# Patient Record
Sex: Female | Born: 1970
Health system: Southern US, Community
[De-identification: ages and names within clinical notes are randomized; demographics above are authoritative.]

## PROBLEM LIST (undated history)

## (undated) DIAGNOSIS — J45909 Unspecified asthma, uncomplicated: Secondary | ICD-10-CM

## (undated) DIAGNOSIS — M949 Disorder of cartilage, unspecified: Secondary | ICD-10-CM

## (undated) DIAGNOSIS — D739 Disease of spleen, unspecified: Secondary | ICD-10-CM

## (undated) DIAGNOSIS — E785 Hyperlipidemia, unspecified: Secondary | ICD-10-CM

## (undated) DIAGNOSIS — M899 Disorder of bone, unspecified: Secondary | ICD-10-CM

## (undated) DIAGNOSIS — M84376A Stress fracture, unspecified foot, initial encounter for fracture: Secondary | ICD-10-CM

## (undated) DIAGNOSIS — D7389 Other diseases of spleen: Secondary | ICD-10-CM

## (undated) DIAGNOSIS — N200 Calculus of kidney: Secondary | ICD-10-CM

## (undated) DIAGNOSIS — K589 Irritable bowel syndrome without diarrhea: Secondary | ICD-10-CM

## (undated) DIAGNOSIS — J309 Allergic rhinitis, unspecified: Secondary | ICD-10-CM

## (undated) HISTORY — PX: BREAST SURGERY: SHX581

## (undated) HISTORY — DX: Disease of spleen, unspecified: D73.9

## (undated) HISTORY — DX: Stress fracture, unspecified foot, initial encounter for fracture: M84.376A

## (undated) HISTORY — PX: OTHER SURGICAL HISTORY: SHX169

## (undated) HISTORY — DX: Unspecified asthma, uncomplicated: J45.909

## (undated) HISTORY — DX: Hyperlipidemia, unspecified: E78.5

## (undated) HISTORY — DX: Calculus of kidney: N20.0

## (undated) HISTORY — DX: Allergic rhinitis, unspecified: J30.9

## (undated) HISTORY — DX: Other diseases of spleen: D73.89

## (undated) HISTORY — DX: Disorder of cartilage, unspecified: M94.9

## (undated) HISTORY — DX: Disorder of bone, unspecified: M89.9

## (undated) HISTORY — DX: Irritable bowel syndrome, unspecified: K58.9

---

## 1998-07-30 ENCOUNTER — Other Ambulatory Visit: Admission: RE | Admit: 1998-07-30 | Discharge: 1998-07-30 | Payer: Self-pay | Admitting: Obstetrics and Gynecology

## 1999-01-15 ENCOUNTER — Other Ambulatory Visit: Admission: RE | Admit: 1999-01-15 | Discharge: 1999-01-15 | Payer: Self-pay | Admitting: Obstetrics and Gynecology

## 2000-02-29 ENCOUNTER — Other Ambulatory Visit: Admission: RE | Admit: 2000-02-29 | Discharge: 2000-02-29 | Payer: Self-pay | Admitting: Obstetrics and Gynecology

## 2001-03-14 ENCOUNTER — Other Ambulatory Visit: Admission: RE | Admit: 2001-03-14 | Discharge: 2001-03-14 | Payer: Self-pay | Admitting: Obstetrics and Gynecology

## 2002-04-12 ENCOUNTER — Other Ambulatory Visit: Admission: RE | Admit: 2002-04-12 | Discharge: 2002-04-12 | Payer: Self-pay | Admitting: Obstetrics and Gynecology

## 2003-04-17 ENCOUNTER — Other Ambulatory Visit: Admission: RE | Admit: 2003-04-17 | Discharge: 2003-04-17 | Payer: Self-pay | Admitting: Obstetrics and Gynecology

## 2004-05-11 ENCOUNTER — Other Ambulatory Visit: Admission: RE | Admit: 2004-05-11 | Discharge: 2004-05-11 | Payer: Self-pay | Admitting: Obstetrics and Gynecology

## 2005-03-15 ENCOUNTER — Ambulatory Visit: Payer: Self-pay | Admitting: Internal Medicine

## 2005-06-07 ENCOUNTER — Other Ambulatory Visit: Admission: RE | Admit: 2005-06-07 | Discharge: 2005-06-07 | Payer: Self-pay | Admitting: Obstetrics and Gynecology

## 2005-11-10 ENCOUNTER — Ambulatory Visit: Payer: Self-pay | Admitting: Internal Medicine

## 2006-01-07 ENCOUNTER — Ambulatory Visit: Payer: Self-pay | Admitting: Internal Medicine

## 2006-01-17 ENCOUNTER — Encounter: Admission: RE | Admit: 2006-01-17 | Discharge: 2006-01-17 | Payer: Self-pay | Admitting: Gastroenterology

## 2006-05-03 ENCOUNTER — Ambulatory Visit: Payer: Self-pay | Admitting: Internal Medicine

## 2006-06-30 ENCOUNTER — Ambulatory Visit: Payer: Self-pay | Admitting: Internal Medicine

## 2006-07-19 ENCOUNTER — Ambulatory Visit: Payer: Self-pay | Admitting: Internal Medicine

## 2006-07-25 ENCOUNTER — Ambulatory Visit: Payer: Self-pay | Admitting: Internal Medicine

## 2006-10-10 ENCOUNTER — Ambulatory Visit: Payer: Self-pay | Admitting: Internal Medicine

## 2006-11-09 ENCOUNTER — Ambulatory Visit: Payer: Self-pay | Admitting: Internal Medicine

## 2006-11-21 ENCOUNTER — Ambulatory Visit: Payer: Self-pay | Admitting: Internal Medicine

## 2006-12-09 ENCOUNTER — Ambulatory Visit: Payer: Self-pay | Admitting: Internal Medicine

## 2007-03-01 ENCOUNTER — Ambulatory Visit: Payer: Self-pay | Admitting: Pulmonary Disease

## 2007-05-15 ENCOUNTER — Ambulatory Visit: Payer: Self-pay | Admitting: Internal Medicine

## 2007-06-20 ENCOUNTER — Ambulatory Visit: Payer: Self-pay | Admitting: Internal Medicine

## 2007-06-21 DIAGNOSIS — J45909 Unspecified asthma, uncomplicated: Secondary | ICD-10-CM

## 2007-06-21 DIAGNOSIS — E785 Hyperlipidemia, unspecified: Secondary | ICD-10-CM

## 2007-06-21 DIAGNOSIS — Q825 Congenital non-neoplastic nevus: Secondary | ICD-10-CM | POA: Insufficient documentation

## 2007-06-21 DIAGNOSIS — J309 Allergic rhinitis, unspecified: Secondary | ICD-10-CM

## 2007-07-05 ENCOUNTER — Ambulatory Visit: Payer: Self-pay | Admitting: Internal Medicine

## 2007-07-17 ENCOUNTER — Ambulatory Visit: Payer: Self-pay | Admitting: Internal Medicine

## 2007-07-17 LAB — CONVERTED CEMR LAB
Basophils Relative: 0.5 % (ref 0.0–1.0)
Bilirubin Urine: NEGATIVE
Bilirubin, Direct: 0.1 mg/dL (ref 0.0–0.3)
CO2: 28 meq/L (ref 19–32)
Calcium: 8.4 mg/dL (ref 8.4–10.5)
Direct LDL: 162.2 mg/dL
Eosinophils Absolute: 0.2 10*3/uL (ref 0.0–0.6)
Eosinophils Relative: 3 % (ref 0.0–5.0)
GFR calc Af Amer: 91 mL/min
GFR calc non Af Amer: 75 mL/min
Glucose, Bld: 87 mg/dL (ref 70–99)
HDL: 44.8 mg/dL (ref >39.0)
Hemoglobin: 13.2 g/dL (ref 12.0–15.0)
Leukocytes, UA: NEGATIVE
Lymphocytes Relative: 35.5 % (ref 12.0–46.0)
MCV: 86.4 fL (ref 78.0–100.0)
Monocytes Absolute: 0.6 10*3/uL (ref 0.2–0.7)
Monocytes Relative: 9.4 % (ref 3.0–11.0)
Neutro Abs: 3 10*3/uL (ref 1.4–7.7)
Platelets: 259 10*3/uL (ref 150–400)
Potassium: 4 meq/L (ref 3.5–5.1)
Specific Gravity, Urine: 1.015 (ref 1.000–1.03)
TSH: 1.58 microintl units/mL (ref 0.35–5.50)
Total Protein: 6.6 g/dL (ref 6.0–8.3)
Triglycerides: 125 mg/dL (ref 0–149)
Urine Glucose: NEGATIVE mg/dL
WBC: 5.9 10*3/uL (ref 4.5–10.5)

## 2007-07-31 ENCOUNTER — Ambulatory Visit: Payer: Self-pay | Admitting: Internal Medicine

## 2007-09-18 ENCOUNTER — Encounter: Payer: Self-pay | Admitting: Internal Medicine

## 2007-09-18 ENCOUNTER — Ambulatory Visit: Payer: Self-pay | Admitting: Internal Medicine

## 2007-10-12 ENCOUNTER — Ambulatory Visit: Payer: Self-pay | Admitting: Internal Medicine

## 2007-10-12 DIAGNOSIS — H6690 Otitis media, unspecified, unspecified ear: Secondary | ICD-10-CM | POA: Insufficient documentation

## 2007-11-21 ENCOUNTER — Ambulatory Visit: Payer: Self-pay | Admitting: Internal Medicine

## 2008-04-01 ENCOUNTER — Ambulatory Visit: Payer: Self-pay | Admitting: Endocrinology

## 2008-04-12 ENCOUNTER — Ambulatory Visit: Payer: Self-pay | Admitting: Internal Medicine

## 2008-08-06 ENCOUNTER — Ambulatory Visit: Payer: Self-pay | Admitting: Internal Medicine

## 2008-08-06 DIAGNOSIS — J019 Acute sinusitis, unspecified: Secondary | ICD-10-CM

## 2008-08-26 ENCOUNTER — Ambulatory Visit: Payer: Self-pay | Admitting: Internal Medicine

## 2008-08-26 LAB — CONVERTED CEMR LAB
ALT: 12 units/L (ref 0–35)
Basophils Absolute: 0 10*3/uL (ref 0.0–0.1)
Basophils Relative: 0.9 % (ref 0.0–3.0)
Bilirubin Urine: NEGATIVE
CO2: 29 meq/L (ref 19–32)
Calcium: 8.7 mg/dL (ref 8.4–10.5)
Cholesterol: 220 mg/dL (ref 0–200)
Creatinine, Ser: 0.9 mg/dL (ref 0.4–1.2)
Crystals: NEGATIVE
Direct LDL: 142.9 mg/dL
GFR calc Af Amer: 90 mL/min
Glucose, Bld: 89 mg/dL (ref 70–99)
HCT: 36.8 % (ref 36.0–46.0)
Hemoglobin: 12.8 g/dL (ref 12.0–15.0)
Ketones, ur: NEGATIVE mg/dL
Lymphocytes Relative: 31.7 % (ref 12.0–46.0)
MCHC: 34.7 g/dL (ref 30.0–36.0)
Monocytes Absolute: 0.4 10*3/uL (ref 0.1–1.0)
Neutro Abs: 3.3 10*3/uL (ref 1.4–7.7)
RBC: 4.17 M/uL (ref 3.87–5.11)
Specific Gravity, Urine: 1.01 (ref 1.000–1.03)
TSH: 2.88 microintl units/mL (ref 0.35–5.50)
Total Protein, Urine: NEGATIVE mg/dL
Total Protein: 6.3 g/dL (ref 6.0–8.3)
Triglycerides: 156 mg/dL — ABNORMAL HIGH (ref 0–149)
Urine Glucose: NEGATIVE mg/dL
pH: 6.5 (ref 5.0–8.0)

## 2008-09-04 ENCOUNTER — Ambulatory Visit: Payer: Self-pay | Admitting: Internal Medicine

## 2009-01-17 ENCOUNTER — Ambulatory Visit: Payer: Self-pay | Admitting: Internal Medicine

## 2009-03-20 ENCOUNTER — Encounter (INDEPENDENT_AMBULATORY_CARE_PROVIDER_SITE_OTHER): Payer: Self-pay | Admitting: Obstetrics and Gynecology

## 2009-03-20 ENCOUNTER — Encounter: Admission: RE | Admit: 2009-03-20 | Discharge: 2009-03-20 | Payer: Self-pay | Admitting: Obstetrics and Gynecology

## 2009-04-01 ENCOUNTER — Encounter: Admission: RE | Admit: 2009-04-01 | Discharge: 2009-04-01 | Payer: Self-pay | Admitting: Obstetrics and Gynecology

## 2009-08-27 ENCOUNTER — Ambulatory Visit: Payer: Self-pay | Admitting: Internal Medicine

## 2009-08-27 LAB — CONVERTED CEMR LAB
ALT: 13 units/L (ref 0–35)
Alkaline Phosphatase: 46 units/L (ref 39–117)
Basophils Relative: 0.7 % (ref 0.0–3.0)
Bilirubin Urine: NEGATIVE
Bilirubin, Direct: 0 mg/dL (ref 0.0–0.3)
Calcium: 8.6 mg/dL (ref 8.4–10.5)
Creatinine, Ser: 0.9 mg/dL (ref 0.4–1.2)
Eosinophils Absolute: 0.2 10*3/uL (ref 0.0–0.7)
Eosinophils Relative: 2.5 % (ref 0.0–5.0)
GFR calc non Af Amer: 73.85 mL/min (ref >60)
Lymphocytes Relative: 25.4 % (ref 12.0–46.0)
Monocytes Relative: 6 % (ref 3.0–12.0)
Neutrophils Relative %: 65.4 % (ref 43.0–77.0)
Nitrite: NEGATIVE
RBC: 4.23 M/uL (ref 3.87–5.11)
Total CHOL/HDL Ratio: 4
Total Protein: 6.9 g/dL (ref 6.0–8.3)
Triglycerides: 187 mg/dL — ABNORMAL HIGH (ref 0.0–149.0)
WBC: 6.6 10*3/uL (ref 4.5–10.5)
pH: 7 (ref 5.0–8.0)

## 2009-08-29 ENCOUNTER — Ambulatory Visit: Payer: Self-pay | Admitting: Internal Medicine

## 2009-08-29 DIAGNOSIS — N390 Urinary tract infection, site not specified: Secondary | ICD-10-CM | POA: Insufficient documentation

## 2009-09-01 ENCOUNTER — Telehealth: Payer: Self-pay | Admitting: Internal Medicine

## 2009-09-05 ENCOUNTER — Ambulatory Visit: Payer: Self-pay | Admitting: Internal Medicine

## 2009-10-24 ENCOUNTER — Ambulatory Visit: Payer: Self-pay | Admitting: Internal Medicine

## 2009-12-22 ENCOUNTER — Telehealth: Payer: Self-pay | Admitting: Internal Medicine

## 2010-03-02 ENCOUNTER — Ambulatory Visit: Payer: Self-pay | Admitting: Internal Medicine

## 2010-03-02 DIAGNOSIS — M84376A Stress fracture, unspecified foot, initial encounter for fracture: Secondary | ICD-10-CM | POA: Insufficient documentation

## 2010-03-23 ENCOUNTER — Encounter: Admission: RE | Admit: 2010-03-23 | Discharge: 2010-03-23 | Payer: Self-pay | Admitting: Obstetrics and Gynecology

## 2010-03-31 ENCOUNTER — Ambulatory Visit: Payer: Self-pay | Admitting: Internal Medicine

## 2010-03-31 DIAGNOSIS — M899 Disorder of bone, unspecified: Secondary | ICD-10-CM | POA: Insufficient documentation

## 2010-03-31 DIAGNOSIS — M949 Disorder of cartilage, unspecified: Secondary | ICD-10-CM

## 2010-03-31 DIAGNOSIS — J069 Acute upper respiratory infection, unspecified: Secondary | ICD-10-CM | POA: Insufficient documentation

## 2010-03-31 LAB — CONVERTED CEMR LAB
Bilirubin Urine: NEGATIVE
Cholesterol, target level: 200 mg/dL
HDL goal, serum: 40 mg/dL
LDL Goal: 160 mg/dL
Leukocytes, UA: NEGATIVE
Nitrite: NEGATIVE

## 2010-06-05 ENCOUNTER — Ambulatory Visit: Payer: Self-pay | Admitting: Internal Medicine

## 2010-07-29 ENCOUNTER — Ambulatory Visit: Payer: Self-pay | Admitting: Internal Medicine

## 2010-07-29 DIAGNOSIS — L259 Unspecified contact dermatitis, unspecified cause: Secondary | ICD-10-CM

## 2010-08-27 ENCOUNTER — Ambulatory Visit: Payer: Self-pay | Admitting: Internal Medicine

## 2010-08-28 LAB — CONVERTED CEMR LAB
BUN: 12 mg/dL (ref 6–23)
Basophils Relative: 0.6 % (ref 0.0–3.0)
Bilirubin Urine: NEGATIVE
Chloride: 102 meq/L (ref 96–112)
Creatinine, Ser: 0.9 mg/dL (ref 0.4–1.2)
Eosinophils Absolute: 0.1 10*3/uL (ref 0.0–0.7)
Eosinophils Relative: 1.3 % (ref 0.0–5.0)
Glucose, Bld: 77 mg/dL (ref 70–99)
HDL: 59.4 mg/dL (ref >39.00)
Hemoglobin: 12.5 g/dL (ref 12.0–15.0)
Leukocytes, UA: NEGATIVE
Lymphocytes Relative: 28.1 % (ref 12.0–46.0)
MCHC: 35.1 g/dL (ref 30.0–36.0)
MCV: 91.1 fL (ref 78.0–100.0)
Monocytes Absolute: 0.4 10*3/uL (ref 0.1–1.0)
Neutro Abs: 3.2 10*3/uL (ref 1.4–7.7)
Nitrite: NEGATIVE
RBC: 3.91 M/uL (ref 3.87–5.11)
Total Bilirubin: 0.5 mg/dL (ref 0.3–1.2)
Total CHOL/HDL Ratio: 4
Triglycerides: 154 mg/dL — ABNORMAL HIGH (ref 0.0–149.0)
Urobilinogen, UA: 0.2 (ref 0.0–1.0)
WBC: 5.2 10*3/uL (ref 4.5–10.5)

## 2010-09-07 ENCOUNTER — Encounter: Payer: Self-pay | Admitting: Internal Medicine

## 2010-09-07 ENCOUNTER — Ambulatory Visit: Payer: Self-pay | Admitting: Internal Medicine

## 2010-09-07 DIAGNOSIS — R300 Dysuria: Secondary | ICD-10-CM

## 2010-09-07 LAB — CONVERTED CEMR LAB
Ketones, ur: NEGATIVE mg/dL
Urine Glucose: NEGATIVE mg/dL
Urobilinogen, UA: 0.2 (ref 0.0–1.0)

## 2010-10-07 ENCOUNTER — Encounter: Admission: RE | Admit: 2010-10-07 | Discharge: 2010-10-07 | Payer: Self-pay | Admitting: Obstetrics and Gynecology

## 2010-12-09 ENCOUNTER — Encounter: Payer: Self-pay | Admitting: Internal Medicine

## 2011-01-01 ENCOUNTER — Ambulatory Visit
Admission: RE | Admit: 2011-01-01 | Discharge: 2011-01-01 | Payer: Self-pay | Source: Home / Self Care | Attending: Surgery | Admitting: Surgery

## 2011-01-01 LAB — POCT HEMOGLOBIN-HEMACUE: Hemoglobin: 13.8 g/dL (ref 12.0–15.0)

## 2011-01-03 LAB — CONVERTED CEMR LAB
Blood in Urine, dipstick: NEGATIVE
Glucose, Urine, Semiquant: NEGATIVE
Ketones, urine, test strip: NEGATIVE
Protein, U semiquant: NEGATIVE
WBC Urine, dipstick: NEGATIVE

## 2011-01-05 NOTE — Assessment & Plan Note (Signed)
Summary: sore throat/ear pain/cd   Vital Signs:  Patient profile:   40 year old female Height:      66.5 inches Weight:      177 pounds BMI:     28.24 O2 Sat:      98 % on Room air Temp:     98.2 degrees F oral Pulse rate:   60 / minute BP sitting:   110 / 70  (left arm) Cuff size:   regular  Vitals Entered By: Zella Ball Ewing CMA (AAMA) (June 05, 2010 1:58 PM)  O2 Flow:  Room air CC: Sore throat, both ears hurt/RE   CC:  Sore throat and both ears hurt/RE.  History of Present Illness: here with acute onset facial pain, pressure, fever, and greenish d/c, as well as ear fullness but not severe pain, d/c, hearing loss, dizziness or n/v.  This is on top the nasal allergy symptoms mild uncontrolled in recent wks, not taking her allergy meds. Pt denies CP, sob, doe, wheezing, orthopnea, pnd, worsening LE edema, palps, dizziness or syncope  Overall o/w good compliance with meds, good tolerance.    Problems Prior to Update: 1)  Osteopenia  (ICD-733.90) 2)  Uri  (ICD-465.9) 3)  Stress Fracture, Foot  (ICD-733.94) 4)  Uti  (ICD-599.0) 5)  Preventive Health Care  (ICD-V70.0) 6)  Uti  (ICD-599.0) 7)  Preventive Health Care  (ICD-V70.0) 8)  Sinusitis- Acute-nos  (ICD-461.9) 9)  Otitis Media, Acute, Bilateral  (ICD-382.9) 10)  Family History Diabetes 1st Degree Relative  (ICD-V18.0) 11)  Family History of Cad Female 1st Degree Relative <50  (ICD-V17.3) 12)  Birthmark  (ICD-757.32) 13)  Hyperlipidemia  (ICD-272.4) 14)  Asthma  (ICD-493.90) 15)  Allergic Rhinitis  (ICD-477.9)  Medications Prior to Update: 1)  Advair Diskus 250-50 Mcg/dose Misc (Fluticasone-Salmeterol) .... Inhale 1 Puff As Directed Twice A Day 2)  Fexofenadine Hcl 180 Mg Tabs (Fexofenadine Hcl) .... Take 1 By Mouth Once Daily 3)  Ortho-Cyclen (28) 0.25-35 Mg-Mcg  Tabs (Norgestimate-Eth Estradiol) 4)  Levaquin 500 Mg Tabs (Levofloxacin) .Marland Kitchen.. 1 By Mouth Once Daily  Current Medications (verified): 1)  Advair Diskus 250-50  Mcg/dose Misc (Fluticasone-Salmeterol) .... Inhale 1 Puff As Directed Twice A Day 2)  Fexofenadine Hcl 180 Mg Tabs (Fexofenadine Hcl) .... Take 1 By Mouth Once Daily 3)  Ortho-Cyclen (28) 0.25-35 Mg-Mcg  Tabs (Norgestimate-Eth Estradiol) 4)  Cephalexin 500 Mg Caps (Cephalexin) .Marland Kitchen.. 1po Three Times A Day  Allergies (verified): 1)  ! Codeine 2)  ! Erythromycin 3)  ! Avelox (Moxifloxacin Hcl) 4)  ! Clarithromycin (Clarithromycin)  Past History:  Past Medical History: Last updated: 03/31/2010 Allergic rhinitis Asthma Hyperlipidemia Osteopenia  Past Surgical History: Last updated: 06/21/2007 Skin Graft-R Forehead  Social History: Last updated: 09/04/2008 Married Alcohol use-yes Former Smoker 1 son work - Product manager - [employer]  Risk Factors: Smoking Status: quit (10/12/2007)  Review of Systems       all otherwise negative per pt -    Physical Exam  General:  alert and overweight-appearing but contuing to try to lose wt Head:  normocephalic and atraumatic.   Eyes:  vision grossly intact, pupils equal, and pupils round.   Ears:  bilat tm's severe red, with mild bulging, canals clear, sinus tender bilat, right > left Nose:  nasal dischargemucosal pallor and mucosal edema.   Mouth:  pharyngeal erythema and fair dentition.   Neck:  supple and cervical lymphadenopathy.   Lungs:  normal respiratory effort and normal breath sounds.  Heart:  normal rate and regular rhythm.   Extremities:  no edema, no erythema    Impression & Recommendations:  Problem # 1:  SINUSITIS- ACUTE-NOS (ICD-461.9)  Her updated medication list for this problem includes:    Cephalexin 500 Mg Caps (Cephalexin) .Marland Kitchen... 1po three times a day with bilat ear involvement as well - treat as above, f/u any worsening signs or symptoms   Problem # 2:  ALLERGIC RHINITIS (ICD-477.9)  Her updated medication list for this problem includes:    Fexofenadine Hcl 180 Mg Tabs (Fexofenadine hcl) .Marland Kitchen...  Take 1 by mouth once daily treat as above, f/u any worsening signs or symptoms   Problem # 3:  ASTHMA (ICD-493.90)  Her updated medication list for this problem includes:    Advair Diskus 250-50 Mcg/dose Misc (Fluticasone-salmeterol) ..... Inhale 1 puff as directed twice a day treat as above, f/u any worsening signs or symptoms   Complete Medication List: 1)  Advair Diskus 250-50 Mcg/dose Misc (Fluticasone-salmeterol) .... Inhale 1 puff as directed twice a day 2)  Fexofenadine Hcl 180 Mg Tabs (Fexofenadine hcl) .... Take 1 by mouth once daily 3)  Ortho-cyclen (28) 0.25-35 Mg-mcg Tabs (Norgestimate-eth estradiol) 4)  Cephalexin 500 Mg Caps (Cephalexin) .Marland Kitchen.. 1po three times a day  Patient Instructions: 1)  Please take all new medications as prescribed  2)  Continue all previous medications as before this visit  3)  You can also use Mucinex OTC or it's generic for congestion  4)  , or Sudafed for the same 5)  Please schedule a follow-up appointment in 3 months with CPX labs Prescriptions: CEPHALEXIN 500 MG CAPS (CEPHALEXIN) 1po three times a day  #30 x 0   Entered and Authorized by:   Corwin Levins MD   Signed by:   Corwin Levins MD on 06/05/2010   Method used:   Print then Give to Patient   RxID:   [id number]

## 2011-01-05 NOTE — Assessment & Plan Note (Signed)
Summary: EARS ACHE---URINE BURN--STC   Vital Signs:  Patient profile:   40 year old female Height:      67 inches Weight:      184 pounds BMI:     28.92 O2 Sat:      98 % on Room air Temp:     98.2 degrees F oral Pulse rate:   62 / minute BP sitting:   110 / 72  (left arm) Cuff size:   regular  Vitals Entered ByZella Ball Ewing (March 02, 2010 11:04 AM)  O2 Flow:  Room air CC: ear pain, pain when urinating/RE   CC:  ear pain and pain when urinating/RE.  History of Present Illness: here with acute onset mild to mod bilat earache, fever, haedache , ST, , general malaise, as well as mild lower abd pain with dysuria for 2 days and  nausea without vomiting , and Pt denies CP, sob, doe, wheezing, orthopnea, pnd, worsening LE edema, palps, dizziness or syncope   Pt denies new neuro symptoms such as headache, facial or extremity weakness , and no high fever or chills., rash or joint pains.  Has lost about 10 lbs with better diet.   Also recently with right foot stress fracture, and hx left foot fx in the past;  no prior hx of bone disease or other fx, no prior dxa  Problems Prior to Update: 1)  Stress Fracture, Foot  (ICD-733.94) 2)  Uti  (ICD-599.0) 3)  Preventive Health Care  (ICD-V70.0) 4)  Uti  (ICD-599.0) 5)  Preventive Health Care  (ICD-V70.0) 6)  Sinusitis- Acute-nos  (ICD-461.9) 7)  Otitis Media, Acute, Bilateral  (ICD-382.9) 8)  Family History Diabetes 1st Degree Relative  (ICD-V18.0) 9)  Family History of Cad Female 1st Degree Relative <50  (ICD-V17.3) 10)  Birthmark  (ICD-757.32) 11)  Hyperlipidemia  (ICD-272.4) 12)  Asthma  (ICD-493.90) 13)  Allergic Rhinitis  (ICD-477.9)  Medications Prior to Update: 1)  Advair Diskus 250-50 Mcg/dose Misc (Fluticasone-Salmeterol) .... Inhale 1 Puff As Directed Twice A Day 2)  Fexofenadine Hcl 180 Mg Tabs (Fexofenadine Hcl) .... Take 1 By Mouth Once Daily 3)  Ortho-Cyclen (28) 0.25-35 Mg-Mcg  Tabs (Norgestimate-Eth Estradiol) 4)  Ceftin  250 Mg Tabs (Cefuroxime Axetil) .Marland Kitchen.. 1po Two Times A Day  Current Medications (verified): 1)  Advair Diskus 250-50 Mcg/dose Misc (Fluticasone-Salmeterol) .... Inhale 1 Puff As Directed Twice A Day 2)  Fexofenadine Hcl 180 Mg Tabs (Fexofenadine Hcl) .... Take 1 By Mouth Once Daily 3)  Ortho-Cyclen (28) 0.25-35 Mg-Mcg  Tabs (Norgestimate-Eth Estradiol) 4)  Cephalexin 500 Mg Caps (Cephalexin) .Marland Kitchen.. 1po Three Times A Day  Allergies (verified): 1)  ! Codeine 2)  ! Erythromycin 3)  ! Avelox (Moxifloxacin Hcl) 4)  ! 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   Eyes:  vision grossly intact, pupils equal, and pupils round.   Ears:  bilat tm's red, sinus tender bilat Nose:  nasal dischargemucosal pallor and mucosal edema.   Mouth:  pharyngeal erythema and fair dentition.  Neck:  supple and cervical lymphadenopathy.   Lungs:  normal respiratory effort and normal breath sounds.   Heart:  normal rate and regular rhythm.   Abdomen:  soft and normal bowel sounds.  , with mild low abd tender, no guarding or rebound Msk:  no foot swelling or redness or tender now bialt , no flank tender Extremities:  no edema, no erythema    Impression & Recommendations:  Problem # 1:  SINUSITIS- ACUTE-NOS (ICD-461.9)  Her updated medication list for this problem includes:    Cephalexin 500 Mg Caps (Cephalexin) .Marland Kitchen... 1po three times a day treat as above, f/u any worsening signs or  symptoms   Problem # 2:  UTI (ICD-599.0)  Her updated medication list for this problem includes:    Cephalexin 500 Mg Caps (Cephalexin) .Marland Kitchen... 1po three times a day  Orders: T-Culture, Urine ([id number]) treat as above, f/u any worsening signs or symptoms , check urine cx  Problem # 3:  STRESS FRACTURE, FOOT (ICD-733.94)  right foot recent, with hx of left foot in the past;  will check dxa for ? of underlying bone disease  Orders: T-Bone Densitometry (09811)  Complete Medication List: 1)  Advair Diskus 250-50 Mcg/dose Misc (Fluticasone-salmeterol) .... Inhale 1 puff as directed twice a day 2)  Fexofenadine Hcl 180 Mg Tabs (Fexofenadine hcl) .... Take 1 by mouth once daily 3)  Ortho-cyclen (28) 0.25-35 Mg-mcg Tabs (Norgestimate-eth estradiol) 4)  Cephalexin 500 Mg Caps (Cephalexin) .Marland Kitchen.. 1po three times a day  Patient Instructions: 1)  Please take all new medications as prescribed - the antibiotic was sent to [facility] 2)  Continue all previous medications as before this visit  3)  Your urine specimen will be sent for culture 4)  Please schedule the bone density test before leaving today 5)  Please schedule a follow-up appointment ain October 2011 with CPX labs Prescriptions: CEPHALEXIN 500 MG CAPS (CEPHALEXIN) 1po three times a day  #30 x 0   Entered and Authorized by:   Corwin Levins MD   Signed by:   Corwin Levins MD on 03/02/2010   Method used:   Electronically to        [facility]  [address] [id number] (retail)       [address]       [place], [place]  [address]       Ph: [phone] or [phone]       Fax: [phone]   RxID:   [id number]   Laboratory Results   Urine Tests    Routine Urinalysis   Color: yellow Appearance: Clear Glucose: negative   (Normal Range: Negative) Bilirubin: negative   (Normal Range: Negative) Ketone: negative   (Normal Range: Negative) Spec. Gravity: 1.010   (Normal Range: 1.003-1.035) Blood: negative   (Normal Range:  Negative) pH: 7.5   (Normal Range: 5.0-8.0) Protein: negative   (Normal Range: Negative) Urobilinogen: 0.2   (Normal Range: 0-1) Nitrite: negative   (Normal Range: Negative) Leukocyte Esterace: negative   (Normal Range: Negative)

## 2011-01-05 NOTE — Progress Notes (Signed)
Summary: Rx refill  Phone Note Call from Patient   Caller: Patient [phone] Summary of Call: pt called stating that rx she was given at last OV for generic allergra was lost and this is why she has been requesting refills. Rx sent to pharmacy Initial call taken by: Margaret Pyle, CMA,  December 22, 2009 2:24 PM    Prescriptions: FEXOFENADINE HCL 180 MG TABS (FEXOFENADINE HCL) TAKE 1 by mouth once daily  #90 x 2   Entered by:   Margaret Pyle, CMA   Authorized by:   Corwin Levins MD   Signed by:   Margaret Pyle, CMA on 12/22/2009   Method used:   [facility] to        [facility]  [address] [id number] ([facility]       [address]       [place], [place]  [address]       Ph: [phone] or [phone]       Fax: [phone]   RxID:   [id number]

## 2011-01-05 NOTE — Assessment & Plan Note (Signed)
Summary: ?poison oak/earache/cd   Vital Signs:  Patient profile:   40 year old female Height:      67 inches Weight:      176 pounds BMI:     27.67 O2 Sat:      97 % on Room air Temp:     98.4 degrees F oral Pulse rate:   72 / minute BP sitting:   112 / 70  (left arm) Cuff size:   regular  Vitals Entered By: Zella Ball Ewing CMA Duncan Dull) (July 29, 2010 2:36 PM)  O2 Flow:  Room air CC: Poison oak, left ear pain/RE   CC:  Poison oak and left ear pain/RE.  History of Present Illness: here after seen at urgent care 10 days ago , for ? contact dermatitis to left forehead and near the eye that resolved with prendisone., but now with more rash to mult other areas, including right elbow, mid upper chest and left medial thigh;   seemed to occur after she rescued baby rabbits who unfort had been near the poinson ivy in the back yard;  pt tried the soap and water ;  did try an OTC cream but only hleped the itch for a short while; no fever , no worsening sweling ;   Also incidently with left ear and STfor 2 to 3 days, with ? low grade fever,  has some mild dry cough; Pt denies CP, worsening sob, doe, wheezing, orthopnea, pnd, worsening LE edema, palps, dizziness or syncope  Pt denies new neuro symptoms such as headache, facial or extremity weakness  Allegra seems to be working well for the nasal allergy symtpoms without itch, sneeze.   Preventive Screening-Counseling & Management      Drug Use:  no.    Problems Prior to Update: 1)  Contact Dermatitis  (ICD-692.9) 2)  Osteopenia  (ICD-733.90) 3)  Uri  (ICD-465.9) 4)  Stress Fracture, Foot  (ICD-733.94) 5)  Uti  (ICD-599.0) 6)  Preventive Health Care  (ICD-V70.0) 7)  Uti  (ICD-599.0) 8)  Preventive Health Care  (ICD-V70.0) 9)  Sinusitis- Acute-nos  (ICD-461.9) 10)  Otitis Media, Acute, Bilateral  (ICD-382.9) 11)  Family History Diabetes 1st Degree Relative  (ICD-V18.0) 12)  Family History of Cad Female 1st Degree Relative <50  (ICD-V17.3) 13)   Birthmark  (ICD-757.32) 14)  Hyperlipidemia  (ICD-272.4) 15)  Asthma  (ICD-493.90) 16)  Allergic Rhinitis  (ICD-477.9)  Medications Prior to Update: 1)  Advair Diskus 250-50 Mcg/dose Misc (Fluticasone-Salmeterol) .... Inhale 1 Puff As Directed Twice A Day 2)  Fexofenadine Hcl 180 Mg Tabs (Fexofenadine Hcl) .... Take 1 By Mouth Once Daily 3)  Ortho-Cyclen (28) 0.25-35 Mg-Mcg  Tabs (Norgestimate-Eth Estradiol) 4)  Cephalexin 500 Mg Caps (Cephalexin) .Marland Kitchen.. 1po Three Times A Day  Current Medications (verified): 1)  Advair Diskus 250-50 Mcg/dose Misc (Fluticasone-Salmeterol) .... Inhale 1 Puff As Directed Twice A Day 2)  Fexofenadine Hcl 180 Mg Tabs (Fexofenadine Hcl) .... Take 1 By Mouth Once Daily 3)  Ortho-Cyclen (28) 0.25-35 Mg-Mcg  Tabs (Norgestimate-Eth Estradiol) 4)  Septra Ds 800-160 Mg Tabs (Sulfamethoxazole-Trimethoprim) .Marland Kitchen.. 1 By Mouth Two Times A Day 5)  Prednisone 10 Mg Tabs (Prednisone) .... 3po Qd For 3days, Then 2po Qd For 3days, Then 1po Qd For 3days, Then Stop  Allergies (verified): 1)  ! Codeine 2)  ! Erythromycin 3)  ! Avelox (Moxifloxacin Hcl) 4)  ! 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  , mild ill  Head:  normocephalic and atraumatic.   Eyes:  vision grossly intact, pupils equal,  and pupils round.   Ears:  left tm severe red, right tm ok; sinus tedner bilat maxillary area, ear canals ok Nose:  nasal dischargemucosal pallor and mucosal edema.   Mouth:  pharyngeal erythema and fair dentition.   Neck:  supple and no masses.   Lungs:  normal respiratory effort and normal breath sounds.   Heart:  normal rate and regular rhythm.   Extremities:  no edema, no erythema  Skin:  mult areas rash c/w contact derm areas detailed per HPI   Impression & Recommendations:  Problem # 1:  SINUSITIS- ACUTE-NOS (ICD-461.9)  Her updated medication list for this problem includes:    Septra Ds 800-160 Mg Tabs (Sulfamethoxazole-trimethoprim) .Marland Kitchen... 1 by mouth two times a day and left ear pain/inflammation - for antibx treat as above, f/u any worsening signs or symptoms, also for mucinex otc as needed   Problem # 2:  CONTACT DERMATITIS (ICD-692.9)  Her updated medication list for this problem includes:    Fexofenadine Hcl 180 Mg Tabs (Fexofenadine hcl) .Marland Kitchen... Take 1 by mouth once daily    Prednisone 10 Mg Tabs (Prednisone) .Marland Kitchen... 3po qd for 3days, then 2po qd for 3days, then 1po qd for 3days, then stop to ant neck , right arm and thighs relatively large areas - for depomedrol IM, and pred pack for homw  Orders: Depo- Medrol 40mg  (J1030) Depo- Medrol 80mg  (J1040) Admin of Therapeutic Inj  intramuscular or subcutaneous (16109)  Problem # 3:  ALLERGIC RHINITIS (ICD-477.9)  Her updated medication list for this problem includes:    Fexofenadine Hcl 180 Mg Tabs (Fexofenadine hcl) .Marland Kitchen... Take 1 by mouth once daily stable overall by hx and exam, ok to continue meds/tx as is   Problem # 4:  ASTHMA (ICD-493.90)  Her updated medication list for this problem includes:    Advair Diskus 250-50 Mcg/dose Misc (Fluticasone-salmeterol) ..... Inhale 1 puff as directed twice a day    Prednisone 10 Mg Tabs (Prednisone) .Marland Kitchen... 3po qd for 3days, then 2po qd for 3days, then 1po qd for 3days, then  stop stable overall by hx and exam, ok to continue meds/tx as is   Complete Medication List: 1)  Advair Diskus 250-50 Mcg/dose Misc (Fluticasone-salmeterol) .... Inhale 1 puff as directed twice a day 2)  Fexofenadine Hcl 180 Mg Tabs (Fexofenadine hcl) .... Take 1 by mouth once daily 3)  Ortho-cyclen (28) 0.25-35 Mg-mcg Tabs (Norgestimate-eth estradiol) 4)  Septra Ds 800-160 Mg Tabs (Sulfamethoxazole-trimethoprim) .Marland Kitchen.. 1 by mouth two times a day 5)  Prednisone 10 Mg Tabs (Prednisone) .... 3po qd for 3days, then 2po qd for 3days, then 1po qd for 3days, then stop  Patient Instructions: 1)  you had the steroid shot today 2)  Please take all new medications as prescribed 3)  Continue all previous medications as before this visit  4)  You can also use Mucinex OTC or it's generic for congestion  5)  Please schedule a follow-up appointment in 2 months with CPX labs Prescriptions: PREDNISONE 10 MG TABS (PREDNISONE) 3po qd for 3days, then 2po qd for 3days, then 1po qd for 3days, then stop  #18 x 0   Entered and Authorized by:   Corwin Levins MD   Signed by:   Corwin Levins MD on 07/29/2010   Method used:   Print then Give to Patient   RxID:   [id number] SEPTRA DS 800-160 MG TABS (SULFAMETHOXAZOLE-TRIMETHOPRIM) 1 by mouth two times a day  #20 x 0   Entered and Authorized by:   Corwin Levins MD   Signed by:   Corwin Levins MD on 07/29/2010   Method used:   Print then Give to Patient   RxID:   [id number]    Medication Administration  Injection # 1:    Medication: Depo- Medrol 40mg     Diagnosis: CONTACT DERMATITIS (ICD-692.9)    Route: IM    Site: LUOQ gluteus    Exp Date: 03/2013    Lot #: [id number]    Mfr: [employer]    Comments: Patient received 120mg  Depo-medrol    Patient tolerated injection without complications    Given by: Zella Ball Ewing CMA (AAMA) (July 29, 2010 3:21 PM)  Injection # 2:    Medication: Depo- Medrol 80mg     Diagnosis: CONTACT DERMATITIS (ICD-692.9)    Route:  IM    Site: LUOQ gluteus    Exp Date: 03/2013    Lot #: [id number]    Mfr: [employer]    Given by: Zella Ball Ewing CMA (AAMA) (July 29, 2010 3:21 PM)  Orders Added: 1)  Depo- Medrol 40mg  [J1030] 2)  Depo- Medrol 80mg  [J1040] 3)  Admin of Therapeutic Inj  intramuscular or subcutaneous [96372] 4)  Est. Patient Level IV [84696]

## 2011-01-05 NOTE — Miscellaneous (Signed)
Summary: BONE DENSITY  Clinical Lists Changes  Orders: Added new Test order of T-Lumbar Vertebral Assessment (77082) - Signed 

## 2011-01-05 NOTE — Assessment & Plan Note (Signed)
Summary: CPX /NWS  #   Vital Signs:  Patient profile:   40 year old female Height:      66 inches Weight:      167.50 pounds BMI:     27.13 O2 Sat:      98 % on Room air Temp:     99.5 degrees F oral Pulse rate:   66 / minute BP sitting:   110 / 70  (left arm) Cuff size:   regular  Vitals Entered By: Zella Ball Ewing CMA Duncan Dull) (September 07, 2010 8:37 AM)  O2 Flow:  Room air  Preventive Care Screening  Last Flu Shot:    Date:  09/07/2010    Results:  Fluvax 3+  Mammogram:    Date:  03/06/2010    Results:  normal   CC: ADult Physical/RE   CC:  ADult Physical/RE.  History of Present Illness: overall doing well,  lost 15 lbs in one yr intent iwth better diet and excercise;  Pt denies CP, worsening sob, doe, wheezing, orthopnea, pnd, worsening LE edema, palps, dizziness or syncope  Pt denies new neuro symptoms such as headache, facial or extremity weakness  Pt denies polydipsia, polyuria.  Overall good compliance with meds, trying to follow low chol, DM diet, wt stable, little excercise however .  No fever, wt loss, night sweats, loss of appetite or other constitutional symptoms  Does have recurrent dysuria  since the first of the yr usaully resolving on its own, but now instead of evrey 2 mo, seems to be every 2 to 4 wks,  pt is unaware of fever today, and denies abd pain, back pain, n/v, chills or hematuria or urgency.    Problems Prior to Update: 1)  Contact Dermatitis  (ICD-692.9) 2)  Osteopenia  (ICD-733.90) 3)  Uri  (ICD-465.9) 4)  Stress Fracture, Foot  (ICD-733.94) 5)  Uti  (ICD-599.0) 6)  Preventive Health Care  (ICD-V70.0) 7)  Uti  (ICD-599.0) 8)  Preventive Health Care  (ICD-V70.0) 9)  Sinusitis- Acute-nos  (ICD-461.9) 10)  Otitis Media, Acute, Bilateral  (ICD-382.9) 11)  Family History Diabetes 1st Degree Relative  (ICD-V18.0) 12)  Family History of Cad Female 1st Degree Relative <50  (ICD-V17.3) 13)  Birthmark  (ICD-757.32) 14)  Hyperlipidemia  (ICD-272.4) 15)   Asthma  (ICD-493.90) 16)  Allergic Rhinitis  (ICD-477.9)  Medications Prior to Update: 1)  Advair Diskus 250-50 Mcg/dose Misc (Fluticasone-Salmeterol) .... Inhale 1 Puff As Directed Twice A Day 2)  Fexofenadine Hcl 180 Mg Tabs (Fexofenadine Hcl) .... Take 1 By Mouth Once Daily 3)  Ortho-Cyclen (28) 0.25-35 Mg-Mcg  Tabs (Norgestimate-Eth Estradiol) 4)  Septra Ds 800-160 Mg Tabs (Sulfamethoxazole-Trimethoprim) .Marland Kitchen.. 1 By Mouth Two Times A Day 5)  Prednisone 10 Mg Tabs (Prednisone) .... 3po Qd For 3days, Then 2po Qd For 3days, Then 1po Qd For 3days, Then Stop  Current Medications (verified): 1)  Advair Diskus 250-50 Mcg/dose Misc (Fluticasone-Salmeterol) .... Inhale 1 Puff As Directed Twice A Day 2)  Fexofenadine Hcl 180 Mg Tabs (Fexofenadine Hcl) .... Take 1 By Mouth Once Daily 3)  Ortho-Cyclen (28) 0.25-35 Mg-Mcg  Tabs (Norgestimate-Eth Estradiol)  Allergies (verified): 1)  ! Codeine 2)  ! Erythromycin 3)  ! Avelox (Moxifloxacin Hcl) 4)  ! 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         all otherwise negative per pt -    Physical Exam  General:  alert and overweight-appearing.   Head:  normocephalic and atraumatic.   Eyes:  vision grossly intact, pupils equal, and pupils round.   Ears:  R ear normal and L ear normal.   Nose:  no external deformity and no nasal discharge.   Mouth:  no gingival abnormalities and pharynx pink and moist.   Neck:  supple and no masses.   Lungs:  normal respiratory effort and normal breath sounds.   Heart:  normal rate and regular rhythm.   Abdomen:  soft and normal bowel sounds.  with low mid abd tender mild without guarding or rebound Msk:  no joint tenderness and no joint swelling.   Extremities:  no edema, no erythema  Neurologic:  cranial nerves II-XII intact and strength normal in all extremities.   Skin:  color normal and no rashes.   Psych:  not anxious appearing and not depressed appearing.     Impression & Recommendations:  Problem # 1:  Preventive Health Care (ICD-V70.0) Overall doing well, age appropriate education and counseling updated and referral for appropriate preventive services done unless declined, immunizations up to date or declined, diet counseling done if overweight, urged to quit smoking if smokes , most recent labs reviewed and current ordered if appropriate, ecg reviewed or declined (interpretation per ECG scanned in the EMR if done); information regarding Medicare Prevention requirements given if appropriate; speciality referrals updated as appropriate  Orders: EKG w/ Interpretation (93000)  Problem # 2:  DYSURIA (ICD-788.1)  The following medications were removed from the medication list:    Septra Ds 800-160 Mg Tabs (Sulfamethoxazole-trimethoprim) .Marland Kitchen... 1 by mouth two times a  day  Orders: T-Culture, Urine (11914-78295) TLB-Udip w/ Micro (81001-URINE)  today with low grade temp and mild low abd tender - fur urine studies, may need antibx  Problem # 3:  HYPERLIPIDEMIA (ICD-272.4)  Labs Reviewed: SGOT: 13 (08/27/2010)   SGPT: 12 (08/27/2010)  Lipid Goals: Chol Goal: 200 (03/31/2010)   HDL Goal: 40 (03/31/2010)   LDL Goal: 160 (03/31/2010)   TG Goal: 150 (03/31/2010)  Prior 10 Yr Risk Heart Disease: Not enough information (03/31/2010)   HDL:59.40 (08/27/2010), 52.90 (08/27/2009)  LDL:DEL (08/26/2008), DEL (07/17/2007)  Chol:232 (08/27/2010), 232 (08/27/2009)  Trig:154.0 (08/27/2010), 187.0 (08/27/2009) d/w pt - for lower chol diet  Complete Medication List: 1)  Advair Diskus 250-50 Mcg/dose Misc (Fluticasone-salmeterol) .... Inhale 1 puff as directed twice a day 2)  Fexofenadine Hcl 180 Mg Tabs (Fexofenadine hcl) .... Take 1 by mouth once daily 3)  Ortho-cyclen (28) 0.25-35 Mg-mcg Tabs (Norgestimate-eth estradiol)  Other Orders: Admin 1st Vaccine (62130) Flu Vaccine 58yrs + (86578)  Patient Instructions: 1)  you had the flu shot today 2)  Please go to the Lab  in the basement for your urine tests today  3)  follow lower cholesterol diet 4)  Please schedule a follow-up appointment in 1 year or sooner if needed Prescriptions: FEXOFENADINE HCL 180 MG TABS (FEXOFENADINE HCL) TAKE 1 by mouth once daily  #90 x 3   Entered and Authorized by:   Corwin Levins MD   Signed by:   Corwin Levins MD on 09/07/2010   Method used:   Print then Give to Patient   RxID:   [id number] ADVAIR DISKUS 250-50 MCG/DOSE MISC (FLUTICASONE-SALMETEROL) Inhale 1 puff as directed twice a day  #3 x 3   Entered and Authorized by:   Corwin Levins MD   Signed by:   Corwin Levins MD on 09/07/2010   Method used:   Print then Give to Patient   RxID:   [id number]     Flu Vaccine Consent Questions     Do you have a history of severe allergic reactions to this vaccine? no    Any  prior history of allergic reactions to egg and/or gelatin? no    Do you have a sensitivity to the preservative Thimersol? no    Do you have a past history of Guillan-Barre Syndrome? no    Do you currently have an acute febrile illness? no    Have you ever had a severe reaction to latex? no    Vaccine information given and explained to patient? yes    Are you currently pregnant? no    Lot Number:[id number]   Exp Date:06/05/2011   Site Given  Left Deltoid IMlu

## 2011-01-05 NOTE — Assessment & Plan Note (Signed)
Summary: PER [facility] SCHED  STC   Vital Signs:  Patient profile:   40 year old female Height:      67 inches Weight:      182.13 pounds BMI:     28.63 O2 Sat:      97 % on Room air Temp:     98.1 degrees F oral Pulse rate:   72 / minute BP sitting:   102 / 68  (left arm) Cuff size:   regular  Vitals Entered ByZella Ball Ewing (March 31, 2010 8:11 AM)  O2 Flow:  Room air CC: followup/RE, Lipid Management   CC:  followup/RE and Lipid Management.  History of Present Illness: here after doing well recently with most recent antibx, but unfortunately then left ear worsened again with low grade temp and headache, general fatgieu and malaise, but no ST and Pt denies CP, sob, doe, wheezing, orthopnea, pnd, worsening LE edema, palps, dizziness or syncope   Also unfort developed increased dysuria and lower abd tenderness but overall quite mild, after being resolved after last antibx for approx one wk.  No back of flank pain, no n/v, high fevers or chills.  Has lost some wt recently but states has been excerciseing as well at the new excercise rm at work ([employer]).  also wants to review recent dxa - has lumber spine c/w mild osteopenia.  No further fractures - foot seems healed.   Is very good at taking her calcium and vit d, trying to be active as well.    Problems Prior to Update: 1)  Osteopenia  (ICD-733.90) 2)  Uri  (ICD-465.9) 3)  Stress Fracture, Foot  (ICD-733.94) 4)  Uti  (ICD-599.0) 5)  Preventive Health Care  (ICD-V70.0) 6)  Uti  (ICD-599.0) 7)  Preventive Health Care  (ICD-V70.0) 8)  Sinusitis- Acute-nos  (ICD-461.9) 9)  Otitis Media, Acute, Bilateral  (ICD-382.9) 10)  Family History Diabetes 1st Degree Relative  (ICD-V18.0) 11)  Family History of Cad Female 1st Degree Relative <50  (ICD-V17.3) 12)  Birthmark  (ICD-757.32) 13)  Hyperlipidemia  (ICD-272.4) 14)  Asthma  (ICD-493.90) 15)  Allergic Rhinitis  (ICD-477.9)  Medications Prior to Update: 1)  Advair Diskus 250-50 Mcg/dose Misc  (Fluticasone-Salmeterol) .... Inhale 1 Puff As Directed Twice A Day 2)  Fexofenadine Hcl 180 Mg Tabs (Fexofenadine Hcl) .... Take 1 By Mouth Once Daily 3)  Ortho-Cyclen (28) 0.25-35 Mg-Mcg  Tabs (Norgestimate-Eth Estradiol) 4)  Cephalexin 500 Mg Caps (Cephalexin) .Marland Kitchen.. 1po Three Times A Day  Current Medications (verified): 1)  Advair Diskus 250-50 Mcg/dose Misc (Fluticasone-Salmeterol) .... Inhale 1 Puff As Directed Twice A Day 2)  Fexofenadine Hcl 180 Mg Tabs (Fexofenadine Hcl) .... Take 1 By Mouth Once Daily 3)  Ortho-Cyclen (28) 0.25-35 Mg-Mcg  Tabs (Norgestimate-Eth Estradiol) 4)  Levaquin 500 Mg Tabs (Levofloxacin) .Marland Kitchen.. 1 By Mouth Once Daily  Allergies (verified): 1)  ! Codeine 2)  ! Erythromycin 3)  ! Avelox (Moxifloxacin Hcl) 4)  ! 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   Eyes:  vision grossly intact, pupils  equal, and pupils round.   Ears:  left tm mild to mod erythema with post fluid but no bulging, right tm ok, canals clear, sinus nontender Nose:  nasal dischargemucosal pallor and mucosal edema.   Mouth:  pharyngeal erythema and fair dentition.   Neck:  supple and no masses.   Lungs:  normal respiratory effort and normal breath sounds.   Heart:  normal rate and regular rhythm.   Abdomen:  soft and normal bowel sounds.  but has mild lower mid abd suprapubic tender without guarding or rebound Extremities:  no edema, no erythema    Impression &  Recommendations:  Problem # 1:  UTI (ICD-599.0)  Her updated medication list for this problem includes:    Levaquin 500 Mg Tabs (Levofloxacin) .Marland Kitchen... 1 by mouth once daily prob recurrent - to check urine studies, tx iwth levaquin course  Orders: T-Culture, Urine ([id number]) TLB-Udip w/ Micro (81001-URINE)  Problem # 2:  URI (ICD-465.9)  Her updated medication list for this problem includes:    Fexofenadine Hcl 180 Mg Tabs (Fexofenadine hcl) .Marland Kitchen... Take 1 by mouth once daily with left eustachain valve symtpoms - for treat as above, f/u any worsening signs or symptoms , actifed otc as needed, and levaquin as above  Problem # 3:  OSTEOPENIA (ICD-733.90) very mild by dxa - to f/u 2 yrs;  but also check PTH level  with next labs per pt request  Complete Medication List: 1)  Advair Diskus 250-50 Mcg/dose Misc (Fluticasone-salmeterol) .... Inhale 1 puff as directed twice a day 2)  Fexofenadine Hcl 180 Mg Tabs (Fexofenadine hcl) .... Take 1 by mouth once daily 3)  Ortho-cyclen (28) 0.25-35 Mg-mcg Tabs (Norgestimate-eth estradiol) 4)  Levaquin 500 Mg Tabs (Levofloxacin) .Marland Kitchen.. 1 by mouth once daily   Patient Instructions: 1)  Please take all new medications as prescribed 2)  Continue all previous medications as before this visit 3)  You can also use Mucinex OTC or it's generic for congestion , or actifed otc as needed  4)  Please go to the Lab in the basement for your urine tests today  5)  Please schedule a follow-up appointment in 6 months with CPX labs and: 6)  Parathyroid hormone level: 733.90 7)  Vit D:  733.90 Prescriptions: LEVAQUIN 500 MG TABS (LEVOFLOXACIN) 1 by mouth once daily  #10 x 0   Entered and Authorized by:   Corwin Levins MD   Signed by:   Corwin Levins MD on 03/31/2010   Method used:   Print then Give to Patient   RxID:   [id number]

## 2011-01-07 NOTE — Op Note (Signed)
  NAMELEXXI, KOSLOW               ACCOUNT NO.:  [id number]  MEDICAL RECORD NO.:  [id number]          PATIENT TYPE:  AMB  LOCATION:  DSC                          FACILITY:  [facility]  PHYSICIAN:  Abigail Miyamoto, M.D. DATE OF BIRTH:  07-11-1971  DATE OF PROCEDURE:  01/01/2011 DATE OF DISCHARGE:                              OPERATIVE REPORT   PREOPERATIVE DIAGNOSIS:  Left breast mass.  POSTOPERATIVE DIAGNOSIS:  Left breast mass.  PROCEDURE:  Excision of left breast mass.  SURGEON:  Abigail Miyamoto, MD  ANESTHESIA:  Lidocaine 1% and monitoring anesthesia care.  ESTIMATED BLOOD LOSS:  Minimal.  INDICATIONS:  This is a 40 year old female with a small mass in the left upper outer quadrant of the left breast.  It is less than 1 cm in size. Ultrasound demonstrates a 2-mm x 3-mm x 5-mm mass in the area of concern.  This may represent a small fibroadenoma or fibrocystic change with fat necrosis.  Decision was made to proceed with excision of mass.  PROCEDURE IN DETAIL:  The patient was brought to the operating room, identified as Andrea Bonilla.  She was placed supine on the operating room table and anesthesia was induced.  Her left breast was then prepped and draped in the usual sterile fashion.  The skin overlying the palpable mass at the upper outer quadrant of the breast was then anesthetized with 1% lidocaine.  I made a small incision with a scalpel. I took this down to the breast tissue with the electrocautery.  I then did an excision of the mass after grasping with an Allis clamp.  The mass was excised in its entirety with cautery and sent to Pathology for evaluation.  Hemostasis was achieved with cautery.  I then closed the subcutaneous tissue with interrupted 3-0 Vicryl sutures and closed the skin with a running 4-0 Monocryl.  Steri-Strips, gauze, and Tegaderm were then applied.  The patient tolerated the procedure well.  All counts were correct at the end of procedure.  The  patient was then taken in a stable condition from the operating room to the recovery room.     Abigail Miyamoto, M.D.     DB/MEDQ  D:  01/01/2011  T:  01/01/2011  Job:  [id number]  Electronically Signed by Abigail Miyamoto M.D. on 01/07/2011 10:08:30 AM

## 2011-01-07 NOTE — Consult Note (Signed)
Summary: [facility]   Imported By: Sherian Rein 12/24/2010 11:02:07  _____________________________________________________________________  External Attachment:    Type:   Image     Comment:   External Document

## 2011-01-13 ENCOUNTER — Encounter: Payer: Self-pay | Admitting: Internal Medicine

## 2011-02-02 NOTE — Letter (Signed)
Summary: [facility]   Imported By: Sherian Rein 01/26/2011 10:19:32  _____________________________________________________________________  External Attachment:    Type:   Image     Comment:   External Document

## 2011-02-04 ENCOUNTER — Encounter: Payer: Self-pay | Admitting: Internal Medicine

## 2011-02-04 ENCOUNTER — Ambulatory Visit (INDEPENDENT_AMBULATORY_CARE_PROVIDER_SITE_OTHER): Payer: BC Managed Care – PPO | Admitting: Internal Medicine

## 2011-02-04 DIAGNOSIS — J309 Allergic rhinitis, unspecified: Secondary | ICD-10-CM

## 2011-02-04 DIAGNOSIS — J019 Acute sinusitis, unspecified: Secondary | ICD-10-CM

## 2011-02-04 DIAGNOSIS — J45909 Unspecified asthma, uncomplicated: Secondary | ICD-10-CM

## 2011-02-11 NOTE — Assessment & Plan Note (Signed)
Summary: ?upper respiratory infection/lb   Vital Signs:  Patient profile:   40 year old female Height:      66.5 inches Weight:      162.38 pounds BMI:     25.91 O2 Sat:      97 % on Room air Temp:     98.5 degrees F oral Pulse rate:   72 / minute BP sitting:   110 / 70  (left arm) Cuff size:   regular  Vitals Entered By: Zella Ball Ewing CMA (AAMA) (February 04, 2011 4:12 PM)  O2 Flow:  Room air CC: Chest congestion, ear pain and pressure, sore throat/RE   CC:  Chest congestion, ear pain and pressure, and sore throat/RE.  History of Present Illness: here to f/u - incidetnly with 3 days onset midl to mod facial pain, pressure, fever and greenish d/c , with slight ST, no cough and overall well controlled allergy nasal symtpoms without signficant congestion , itch , sneeze and asthma symptoms without wheezing, nighttime awakenings;  Pt denies CP, worsening sob, doe, wheezing, orthopnea, pnd, worsening LE edema, palps, dizziness or syncope  Pt denies new neuro symptoms such as headache, facial or extremity weakness  Pt denies polydipsia, polyuria   Overall good compliance with meds, trying to follow low chol  diet, wt down over 40 lbs since early last yr intentionally with diet and excercise.  Problems Prior to Update: 1)  Sinusitis- Acute-nos  (ICD-461.9) 2)  Dysuria  (ICD-788.1) 3)  Contact Dermatitis  (ICD-692.9) 4)  Osteopenia  (ICD-733.90) 5)  Uri  (ICD-465.9) 6)  Stress Fracture, Foot  (ICD-733.94) 7)  Uti  (ICD-599.0) 8)  Preventive Health Care  (ICD-V70.0) 9)  Uti  (ICD-599.0) 10)  Preventive Health Care  (ICD-V70.0) 11)  Sinusitis- Acute-nos  (ICD-461.9) 12)  Otitis Media, Acute, Bilateral  (ICD-382.9) 13)  Family History Diabetes 1st Degree Relative  (ICD-V18.0) 14)  Family History of Cad Female 1st Degree Relative <50  (ICD-V17.3) 15)  Birthmark  (ICD-757.32) 16)  Hyperlipidemia  (ICD-272.4) 17)  Asthma  (ICD-493.90) 18)  Allergic Rhinitis  (ICD-477.9)  Medications Prior  to Update: 1)  Advair Diskus 250-50 Mcg/dose Misc (Fluticasone-Salmeterol) .... Inhale 1 Puff As Directed Twice A Day 2)  Fexofenadine Hcl 180 Mg Tabs (Fexofenadine Hcl) .... Take 1 By Mouth Once Daily 3)  Ortho-Cyclen (28) 0.25-35 Mg-Mcg  Tabs (Norgestimate-Eth Estradiol) 4)  Amoxicillin 500 Mg Caps (Amoxicillin) .... 2 By Mouth Two Times A Day  Current Medications (verified): 1)  Advair Diskus 250-50 Mcg/dose Misc (Fluticasone-Salmeterol) .... Inhale 1 Puff As Directed Twice A Day 2)  Fexofenadine Hcl 180 Mg Tabs (Fexofenadine Hcl) .... Take 1 By Mouth Once Daily 3)  Ortho-Cyclen (28) 0.25-35 Mg-Mcg  Tabs (Norgestimate-Eth Estradiol) 4)  Levofloxacin 250 Mg Tabs (Levofloxacin) .Marland Kitchen.. 1po Once Daily  Allergies (verified): 1)  ! Codeine 2)  ! Erythromycin 3)  ! Avelox (Moxifloxacin Hcl) 4)  ! 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   Head:  normocephalic and atraumatic.   Eyes:  vision grossly intact, pupils equal, and  pupils round.   Ears:  bialt tm's mild red, sinus tender bialt, canals clear Nose:  nasal dischargemucosal pallor and mucosal edema.   Mouth:  pharyngeal erythema and fair dentition.   Neck:  supple and no masses.   Lungs:  normal respiratory effort and normal breath sounds.   Heart:  normal rate and regular rhythm.   Extremities:  no edema, no erythema    Impression & Recommendations:  Problem # 1:  SINUSITIS- ACUTE-NOS (ICD-461.9)  Her updated medication list for this  problem includes:    Levofloxacin 250 Mg Tabs (Levofloxacin) .Marland Kitchen... 1po once daily treat as above, f/u any worsening signs or symptoms   Instructed on treatment. Call if symptoms persist or worsen.   Problem # 2:  ASTHMA (ICD-493.90)  Her updated medication list for this problem includes:    Advair Diskus 250-50 Mcg/dose Misc (Fluticasone-salmeterol) ..... Inhale 1 puff as directed twice a day  Pulmonary Functions Reviewed: O2 sat: 97 (02/04/2011) stable overall by hx and exam, ok to continue meds/tx as is   Problem # 3:  ALLERGIC RHINITIS (ICD-477.9)  Her updated medication list for this problem includes:    Fexofenadine Hcl 180 Mg Tabs (Fexofenadine hcl) .Marland Kitchen... Take 1 by mouth once daily  Discussed use of allergy medications and environmental measures.  stable overall by hx and exam, ok to continue meds/tx as is   Complete Medication List: 1)  Advair Diskus 250-50 Mcg/dose Misc (Fluticasone-salmeterol) .... Inhale 1 puff as directed twice a day 2)  Fexofenadine Hcl 180 Mg Tabs (Fexofenadine hcl) .... Take 1 by mouth once daily 3)  Ortho-cyclen (28) 0.25-35 Mg-mcg Tabs (Norgestimate-eth estradiol) 4)  Levofloxacin 250 Mg Tabs (Levofloxacin) .Marland Kitchen.. 1po once daily  Patient Instructions: 1)  Please take all new medications as prescribed 2)  Continue all previous medications as before this visit  3)  You can also use Mucinex OTC or it's generic for congestion , and Delsym OTC for cough 4)  Please schedule a follow-up appointment as needed. Prescriptions: LEVOFLOXACIN 250 MG TABS (LEVOFLOXACIN) 1po once daily  #10 x 0   Entered and Authorized by:   Corwin Levins MD   Signed by:   Corwin Levins MD on 02/04/2011   Method used:   Print then Give to Patient   RxID:   [id number]    Orders Added: 1)  Est. Patient Level IV [29528]

## 2011-03-09 ENCOUNTER — Other Ambulatory Visit: Payer: Self-pay | Admitting: Obstetrics and Gynecology

## 2011-03-09 DIAGNOSIS — Z09 Encounter for follow-up examination after completed treatment for conditions other than malignant neoplasm: Secondary | ICD-10-CM

## 2011-03-25 ENCOUNTER — Ambulatory Visit
Admission: RE | Admit: 2011-03-25 | Discharge: 2011-03-25 | Disposition: A | Discharge status: Home / Self Care | Payer: BC Managed Care – PPO | Source: Ambulatory Visit | Attending: Obstetrics and Gynecology | Admitting: Obstetrics and Gynecology

## 2011-03-25 DIAGNOSIS — Z09 Encounter for follow-up examination after completed treatment for conditions other than malignant neoplasm: Secondary | ICD-10-CM

## 2011-04-23 NOTE — Assessment & Plan Note (Signed)
[name]                             PULMONARY OFFICE NOTE   NAME:Bonilla, Andrea BLEILER                      MRN:          [id number]  DATE:03/01/2007                            DOB:          Jan 14, 1971    HISTORY OF PRESENT ILLNESS:  The patient is a 40 year old white female  patient of Dr. Raphael Gibney who has a history of asthma and allergic rhinitis,  presents for an acute office visit.  The patient complains of a 3 day  history of nasal congestion, post nasal drip, ear fullness, dry cough.  The patient denies any fever, purulent sputum, chest pain, shortness of  breath, recent travel.  The patient has had several recent upper  respiratory infections over the last 4 months and has been on 4 separate  antibiotics.   PAST MEDICAL HISTORY:  Reviewed.   CURRENT MEDICATIONS:  Reviewed.   PHYSICAL EXAMINATION:  The patient is a pleasant female in no acute  distress.  She is afebrile with stable vital signs.  O2 saturation is 99% on room  air.  HEENT:  Nasal mucosa is slightly pale.  Nontender sinuses.  Conjunctivae  are noninjected.  NECK:  Supple without cervical adenopathy.  No jugular venous  distension.  LUNGS:  Sounds are clear.  CARDIAC:  Regular rate.  ABDOMEN:  Soft and nontender.  EXTREMITIES:  Warm without any edema.   IMPRESSION AND PLAN:  Acute rhinitis flare.  The patient is to begin a  nasal hygiene regimen with saline, add in saline and Afrin to her  Nasonex over the next 5 days.  Add in Mucinex twice daily.  The patient  may use some Tylenol Cold and Sinus as needed.  The patient is to return  back with Dr. Posey Rea as scheduled or sooner if needed.  We will hold  on the antibiotics at this time.      Rubye Oaks, NP  Electronically Signed      Lonzo Cloud. Kriste Basque, MD  Electronically Signed   TP/MedQ  DD: 03/02/2007  DT: 03/02/2007  Job #: [id number]

## 2011-04-27 ENCOUNTER — Ambulatory Visit (INDEPENDENT_AMBULATORY_CARE_PROVIDER_SITE_OTHER): Payer: BC Managed Care – PPO | Admitting: Internal Medicine

## 2011-04-27 ENCOUNTER — Encounter: Payer: Self-pay | Admitting: Internal Medicine

## 2011-04-27 ENCOUNTER — Other Ambulatory Visit (INDEPENDENT_AMBULATORY_CARE_PROVIDER_SITE_OTHER): Payer: BC Managed Care – PPO

## 2011-04-27 VITALS — BP 100/68 | HR 71 | Temp 98.4°F | Ht 66.0 in | Wt 164.5 lb

## 2011-04-27 DIAGNOSIS — J45909 Unspecified asthma, uncomplicated: Secondary | ICD-10-CM

## 2011-04-27 DIAGNOSIS — R1084 Generalized abdominal pain: Secondary | ICD-10-CM

## 2011-04-27 DIAGNOSIS — K589 Irritable bowel syndrome without diarrhea: Secondary | ICD-10-CM

## 2011-04-27 DIAGNOSIS — Z0000 Encounter for general adult medical examination without abnormal findings: Secondary | ICD-10-CM | POA: Insufficient documentation

## 2011-04-27 LAB — CBC WITH DIFFERENTIAL/PLATELET
Basophils Absolute: 0 10*3/uL (ref 0.0–0.1)
Basophils Relative: 0.6 % (ref 0.0–3.0)
Eosinophils Absolute: 0.1 10*3/uL (ref 0.0–0.7)
Eosinophils Relative: 1.1 % (ref 0.0–5.0)
HCT: 36.5 % (ref 36.0–46.0)
Hemoglobin: 12.5 g/dL (ref 12.0–15.0)
Lymphocytes Relative: 26.6 % (ref 12.0–46.0)
Lymphs Abs: 2 10*3/uL (ref 0.7–4.0)
MCHC: 34.3 g/dL (ref 30.0–36.0)
MCV: 89.4 fl (ref 78.0–100.0)
Monocytes Absolute: 0.6 10*3/uL (ref 0.1–1.0)
Monocytes Relative: 7.7 % (ref 3.0–12.0)
Neutro Abs: 4.8 10*3/uL (ref 1.4–7.7)
Neutrophils Relative %: 64 % (ref 43.0–77.0)
Platelets: 169 10*3/uL (ref 150.0–400.0)
RBC: 4.08 Mil/uL (ref 3.87–5.11)
RDW: 11.8 % (ref 11.5–14.6)
WBC: 7.6 10*3/uL (ref 4.5–10.5)

## 2011-04-27 LAB — BASIC METABOLIC PANEL
BUN: 12 mg/dL (ref 6–23)
CO2: 29 mEq/L (ref 19–32)
Calcium: 9 mg/dL (ref 8.4–10.5)
Chloride: 106 mEq/L (ref 96–112)
Creatinine, Ser: 0.8 mg/dL (ref 0.4–1.2)
GFR: 82.71 mL/min (ref >60.00)
Glucose, Bld: 86 mg/dL (ref 70–99)
Potassium: 4.2 mEq/L (ref 3.5–5.1)
Sodium: 141 mEq/L (ref 135–145)

## 2011-04-27 LAB — HEPATIC FUNCTION PANEL
ALT: 14 U/L (ref 0–35)
AST: 17 U/L (ref 0–37)
Albumin: 3.6 g/dL (ref 3.5–5.2)
Alkaline Phosphatase: 49 U/L (ref 39–117)
Bilirubin, Direct: 0 mg/dL (ref 0.0–0.3)
Total Protein: 6.7 g/dL (ref 6.0–8.3)

## 2011-04-27 LAB — URINALYSIS, ROUTINE W REFLEX MICROSCOPIC
Bilirubin Urine: NEGATIVE
Ketones, ur: NEGATIVE
Leukocytes, UA: NEGATIVE
Nitrite: NEGATIVE
Specific Gravity, Urine: 1.01 (ref 1.000–1.030)
Urobilinogen, UA: 0.2 (ref 0.0–1.0)
pH: 7 (ref 5.0–8.0)

## 2011-04-27 LAB — LIPASE: Lipase: 40 U/L (ref 11.0–59.0)

## 2011-04-27 MED ORDER — PANTOPRAZOLE SODIUM 40 MG PO TBEC
40.0000 mg | DELAYED_RELEASE_TABLET | Freq: Every day | ORAL | Status: DC
Start: 2011-04-27 — End: 2011-06-19

## 2011-04-27 MED ORDER — PROMETHAZINE HCL 25 MG PO TABS: 25.0000 mg | ORAL_TABLET | Freq: Four times a day (QID) | ORAL | Status: AC | PRN

## 2011-04-27 NOTE — Progress Notes (Signed)
Quick Note:  Voice message left on PhoneTree system - lab is negative, normal or otherwise stable, pt to continue same tx ______ 

## 2011-04-27 NOTE — Progress Notes (Signed)
  Subjective:    Patient ID: Andrea Bonilla, female    DOB: Sep 04, 1971, 40 y.o.   MRN: [id number]  HPI  Here with acute onset pain, sudden onset after eating dinner (no one else sick from this) , seemed to start upper abd, then involved the back bilat, then lower abd as well assoc bloating and sense of fullness;  No n/v, and did have some improvement possibly with BM x 2, but no other bowel change or blood.  Also with some urinary freq  In the past 2 days, but no dysuria, urgency, hematuria or n/v, f/c. No diarrhea.  Denies worsening reflux, dysphagia, other bowel change or blood. Had similar episode twice before after eating peanuts once, and the peanut butter, seemed to resolve on its own, and did have nut snack yesterday that may have had peanut in it.   On BCP, shouldn't be pregnant.   No recent GYN issues or hx of STD, no current symptoms.  LMP approx may 10.   Had dx of IBS as teen, bit none since and no diarrhaea, constipation now.  Pt denies chest pain, increased sob or doe, wheezing, orthopnea, PND, increased LE swelling, palpitations, dizziness or syncope.  No night time awakenings or wheezing, cough. Past Medical History  Diagnosis Date  . ALLERGIC RHINITIS 06/21/2007  . ASTHMA 06/21/2007  . HYPERLIPIDEMIA 06/21/2007  . OSTEOPENIA 03/31/2010  . STRESS FRACTURE, FOOT 03/02/2010   No past surgical history on file.  reports that she has never smoked. She does not have any smokeless tobacco history on file. She reports that she drinks alcohol. She reports that she does not use illicit drugs. family history is not on file. Allergies  Allergen Reactions  . Clarithromycin     REACTION: nausea  . Codeine   . Erythromycin   . Moxifloxacin     REACTION: nausea   No current outpatient prescriptions on file prior to visit.   Review of Systems Review of Systems  Constitutional: Negative for diaphoresis and unexpected weight change.  HENT: Negative for drooling and tinnitus.   Eyes: Negative  for photophobia and visual disturbance.  Respiratory: Negative for choking and stridor.   Gastrointestinal: Negative for vomiting and blood in stool.  Genitourinary: Negative for hematuria and decreased urine volume.  Musculoskeletal: Negative for gait problem.  Skin: Negative for color change and wound.  Neurological: Negative for tremors and numbness.  Psychiatric/Behavioral: Negative for decreased concentration. The patient is not hyperactive.       Objective:   Physical Exam BP 100/68  Pulse 71  Temp(Src) 98.4 F (36.9 C) (Oral)  Ht 5\' 6"  (1.676 m)  Wt 164 lb 8 oz (74.617 kg)  BMI 26.55 kg/m2  SpO2 98%  LMP 04/15/2011 Physical Exam  VS noted, mild uncomfortable but not toxic appearing Constitutional: Pt appears well-developed and well-nourished.  HENT: Head: Normocephalic.  Right Ear: External ear normal.  Left Ear: External ear normal.  Eyes: Conjunctivae and EOM are normal. Pupils are equal, round, and reactive to light.  Neck: Normal range of motion. Neck supple.  Cardiovascular: Normal rate and regular rhythm.   Pulmonary/Chest: Effort normal and breath sounds normal.  Abd:  Soft, NT, non-distended, + BS except for mild epigastric and mid abd tender, no guarding or rebound Neurological: Pt is alert. No cranial nerve deficit.  Skin: Skin is warm. No erythema.  Psychiatric: Pt behavior is normal. Thought content normal.         Assessment & Plan:

## 2011-04-27 NOTE — Assessment & Plan Note (Signed)
stable overall by hx and exam, most recent lab reviewed with pt, and pt to continue medical treatment as before  SpO2 Readings from Last 3 Encounters:  04/27/11 98%  02/04/11 97%  09/07/10 98%

## 2011-04-27 NOTE — Assessment & Plan Note (Signed)
Somewhat suggestive but doubt etiology of symptoms this time, as has no apparent diarrhea/constipation,  to f/u any worsening symptoms or concerns

## 2011-04-27 NOTE — Patient Instructions (Addendum)
Take all new medications as prescribed - the protonix, and phenergan as needed for nausea Continue all other medications as before, except stop the anti-inflammatory Please go to LAB in the Basement for the blood and/or urine tests to be done today You will be contacted regarding the referral for: ultrasound Please call the phone number [phone] (the PhoneTree System) for results of testing in 2-3 days;  When calling, simply dial the number, and when prompted enter the MRN number above (the Medical Record Number) and the # key, then the message should start. Please call or return with any worsening symptoms to consider further evaluation or GI referral Please return in 6 mo with Lab testing done 3-5 days before, or sooner if needed

## 2011-04-27 NOTE — Assessment & Plan Note (Signed)
I suspect primarily gastritis due to nsaid, but cant r/o other such as PUD, pancreatitis, or even GB with the radation of the pain to the back;  Today to stop the nsiad, protonix 40 qd for 1 mo, check lipase and other labs, h pylori, and abd u/s;  Also for phenergan prn;  Is afeb and nontoxic - will hold on any antibx or GI referral at this time unless worsens

## 2011-04-30 ENCOUNTER — Ambulatory Visit
Admission: RE | Admit: 2011-04-30 | Discharge: 2011-04-30 | Disposition: A | Discharge status: Home / Self Care | Payer: BC Managed Care – PPO | Source: Ambulatory Visit | Attending: Internal Medicine | Admitting: Internal Medicine

## 2011-04-30 ENCOUNTER — Telehealth: Payer: Self-pay | Admitting: Internal Medicine

## 2011-04-30 DIAGNOSIS — R1084 Generalized abdominal pain: Secondary | ICD-10-CM

## 2011-04-30 NOTE — Telephone Encounter (Signed)
See message on u/s report today

## 2011-05-10 ENCOUNTER — Ambulatory Visit
Admission: RE | Admit: 2011-05-10 | Discharge: 2011-05-10 | Disposition: A | Discharge status: Home / Self Care | Payer: BC Managed Care – PPO | Source: Ambulatory Visit | Attending: Internal Medicine | Admitting: Internal Medicine

## 2011-05-10 DIAGNOSIS — R1084 Generalized abdominal pain: Secondary | ICD-10-CM

## 2011-05-10 MED ORDER — GADOBENATE DIMEGLUMINE 529 MG/ML IV SOLN
15.0000 mL | Freq: Once | INTRAVENOUS | Status: AC | PRN
  Administered 2011-05-10: 15 mL via INTRAVENOUS

## 2011-05-11 ENCOUNTER — Other Ambulatory Visit: Payer: Self-pay | Admitting: Internal Medicine

## 2011-05-11 DIAGNOSIS — R161 Splenomegaly, not elsewhere classified: Secondary | ICD-10-CM | POA: Insufficient documentation

## 2011-05-21 ENCOUNTER — Encounter: Payer: BC Managed Care – PPO | Admitting: Oncology

## 2011-05-21 ENCOUNTER — Other Ambulatory Visit: Payer: Self-pay | Admitting: Oncology

## 2011-05-21 ENCOUNTER — Encounter (HOSPITAL_BASED_OUTPATIENT_CLINIC_OR_DEPARTMENT_OTHER): Payer: BC Managed Care – PPO | Admitting: Oncology

## 2011-05-21 DIAGNOSIS — R161 Splenomegaly, not elsewhere classified: Secondary | ICD-10-CM

## 2011-05-21 LAB — CBC WITH DIFFERENTIAL/PLATELET
BASO%: 0.2 % (ref 0.0–2.0)
Basophils Absolute: 0 10*3/uL (ref 0.0–0.1)
EOS%: 0.8 % (ref 0.0–7.0)
HGB: 12.6 g/dL (ref 11.6–15.9)
MCH: 30.4 pg (ref 25.1–34.0)
RDW: 12.3 % (ref 11.2–14.5)
lymph#: 1.5 10*3/uL (ref 0.9–3.3)

## 2011-05-21 LAB — MORPHOLOGY

## 2011-05-24 LAB — URIC ACID: Uric Acid, Serum: 4.5 mg/dL (ref 2.4–7.0)

## 2011-05-24 LAB — ANGIOTENSIN CONVERTING ENZYME: Angiotensin 1 CE: 30 U/L (ref 8–52)

## 2011-05-24 LAB — COMPREHENSIVE METABOLIC PANEL
Alkaline Phosphatase: 48 U/L (ref 39–117)
BUN: 11 mg/dL (ref 6–23)
Creatinine, Ser: 1 mg/dL (ref 0.50–1.10)
Glucose, Bld: 87 mg/dL (ref 70–99)
Sodium: 138 mEq/L (ref 135–145)
Total Bilirubin: 0.5 mg/dL (ref 0.3–1.2)

## 2011-05-24 LAB — BETA 2 MICROGLOBULIN, SERUM: Beta-2 Microglobulin: 1.54 mg/L (ref 1.01–1.73)

## 2011-06-03 ENCOUNTER — Ambulatory Visit (HOSPITAL_COMMUNITY)
Admission: RE | Admit: 2011-06-03 | Discharge: 2011-06-03 | Disposition: A | Discharge status: Home / Self Care | Payer: BC Managed Care – PPO | Source: Ambulatory Visit | Attending: Oncology | Admitting: Oncology

## 2011-06-03 ENCOUNTER — Encounter (HOSPITAL_COMMUNITY): Payer: Self-pay

## 2011-06-03 DIAGNOSIS — N6489 Other specified disorders of breast: Secondary | ICD-10-CM | POA: Insufficient documentation

## 2011-06-03 DIAGNOSIS — Z79899 Other long term (current) drug therapy: Secondary | ICD-10-CM | POA: Insufficient documentation

## 2011-06-03 DIAGNOSIS — R109 Unspecified abdominal pain: Secondary | ICD-10-CM | POA: Insufficient documentation

## 2011-06-03 DIAGNOSIS — D739 Disease of spleen, unspecified: Secondary | ICD-10-CM | POA: Insufficient documentation

## 2011-06-03 DIAGNOSIS — R079 Chest pain, unspecified: Secondary | ICD-10-CM | POA: Insufficient documentation

## 2011-06-03 MED ORDER — IOHEXOL 300 MG/ML  SOLN
100.0000 mL | Freq: Once | INTRAMUSCULAR | Status: AC | PRN
  Administered 2011-06-03: 100 mL via INTRAVENOUS

## 2011-06-07 ENCOUNTER — Other Ambulatory Visit: Payer: Self-pay | Admitting: Oncology

## 2011-06-17 ENCOUNTER — Other Ambulatory Visit: Payer: Self-pay | Admitting: Oncology

## 2011-06-17 ENCOUNTER — Ambulatory Visit (HOSPITAL_COMMUNITY): Payer: BC Managed Care – PPO

## 2011-06-17 ENCOUNTER — Other Ambulatory Visit: Payer: Self-pay | Admitting: Interventional Radiology

## 2011-06-17 ENCOUNTER — Ambulatory Visit (HOSPITAL_COMMUNITY)
Admission: RE | Admit: 2011-06-17 | Discharge: 2011-06-17 | Disposition: A | Discharge status: Home / Self Care | Payer: BC Managed Care – PPO | Source: Ambulatory Visit | Attending: Oncology | Admitting: Oncology

## 2011-06-17 DIAGNOSIS — M899 Disorder of bone, unspecified: Secondary | ICD-10-CM | POA: Insufficient documentation

## 2011-06-17 DIAGNOSIS — D739 Disease of spleen, unspecified: Secondary | ICD-10-CM | POA: Insufficient documentation

## 2011-06-17 DIAGNOSIS — M949 Disorder of cartilage, unspecified: Secondary | ICD-10-CM | POA: Insufficient documentation

## 2011-06-17 DIAGNOSIS — E785 Hyperlipidemia, unspecified: Secondary | ICD-10-CM | POA: Insufficient documentation

## 2011-06-17 LAB — CBC
Platelets: 185 10*3/uL (ref 150–400)
RBC: 4.53 MIL/uL (ref 3.87–5.11)
WBC: 5 10*3/uL (ref 4.0–10.5)

## 2011-06-17 LAB — APTT: aPTT: 29 seconds (ref 24–37)

## 2011-06-17 LAB — PROTIME-INR: Prothrombin Time: 12.2 seconds (ref 11.6–15.2)

## 2011-06-19 ENCOUNTER — Other Ambulatory Visit: Payer: Self-pay | Admitting: Internal Medicine

## 2011-06-24 ENCOUNTER — Other Ambulatory Visit: Payer: Self-pay | Admitting: Oncology

## 2011-06-24 ENCOUNTER — Encounter (HOSPITAL_BASED_OUTPATIENT_CLINIC_OR_DEPARTMENT_OTHER): Payer: BC Managed Care – PPO | Admitting: Oncology

## 2011-06-24 DIAGNOSIS — D739 Disease of spleen, unspecified: Secondary | ICD-10-CM

## 2011-06-24 DIAGNOSIS — D7389 Other diseases of spleen: Secondary | ICD-10-CM

## 2011-07-13 ENCOUNTER — Encounter: Payer: Self-pay | Admitting: Internal Medicine

## 2011-07-13 ENCOUNTER — Telehealth (INDEPENDENT_AMBULATORY_CARE_PROVIDER_SITE_OTHER): Payer: Self-pay | Admitting: Surgery

## 2011-07-13 ENCOUNTER — Ambulatory Visit (INDEPENDENT_AMBULATORY_CARE_PROVIDER_SITE_OTHER): Payer: BC Managed Care – PPO | Admitting: Internal Medicine

## 2011-07-13 VITALS — BP 100/62 | HR 69 | Temp 98.7°F | Ht 66.5 in

## 2011-07-13 DIAGNOSIS — J45909 Unspecified asthma, uncomplicated: Secondary | ICD-10-CM

## 2011-07-13 DIAGNOSIS — J019 Acute sinusitis, unspecified: Secondary | ICD-10-CM | POA: Insufficient documentation

## 2011-07-13 DIAGNOSIS — J309 Allergic rhinitis, unspecified: Secondary | ICD-10-CM

## 2011-07-13 MED ORDER — LEVOFLOXACIN 500 MG PO TABS: 500.0000 mg | ORAL_TABLET | Freq: Every day | ORAL | Status: AC

## 2011-07-13 NOTE — Progress Notes (Signed)
  Subjective:    Patient ID: Andrea Bonilla, female    DOB: [birth year], 40 y.o.   MRN: [id number]  HPI   Here with 3 days acute onset fever, facial pain, pressure, general weakness and malaise, and greenish d/c, with slight ST, but little to no cough and Pt denies chest pain, increased sob or doe, wheezing, orthopnea, PND, increased LE swelling, palpitations, dizziness or syncope.  Does have several wks ongoing nasal allergy symptoms with clear congestion, itch and sneeze, without fever, pain, ST, cough or wheezing, but better with current meds.  No night time awakenings.   Past Medical History  Diagnosis Date  . ALLERGIC RHINITIS 06/21/2007  . ASTHMA 06/21/2007  . HYPERLIPIDEMIA 06/21/2007  . OSTEOPENIA 03/31/2010  . STRESS FRACTURE, FOOT 03/02/2010  . Asthma    Past Surgical History  Procedure Date  . Skin graft right forehead     reports that she has never smoked. She does not have any smokeless tobacco history on file. She reports that she drinks alcohol. She reports that she does not use illicit drugs. family history includes Coronary artery disease in her other; Diabetes in her other; Hypertension in her other; and Stroke in her other. Allergies  Allergen Reactions  . Clarithromycin     REACTION: nausea  . Codeine   . Erythromycin   . Moxifloxacin     REACTION: nausea   Current Outpatient Prescriptions on File Prior to Visit  Medication Sig Dispense Refill  . fexofenadine (ALLEGRA) 180 MG tablet Take 180 mg by mouth daily.        . Fluticasone-Salmeterol (ADVAIR DISKUS) 250-50 MCG/DOSE AEPB Inhale 1 puff into the lungs 2 (two) times daily.        . norgestimate-ethinyl estradiol (ORTHO-CYCLEN) 0.25-35 MG-MCG per tablet Take 1 tablet by mouth daily.         Review of Systems Review of Systems  Constitutional: Negative for diaphoresis and unexpected weight change.  HENT: Negative for drooling and tinnitus.   Eyes: Negative for photophobia and visual disturbance.  Respiratory:  Negative for choking and stridor.   Gastrointestinal: Negative for vomiting and blood in stool.        Objective:   Physical Exam BP 100/62  Pulse 69  Temp(Src) 98.7 F (37.1 C) (Oral)  Ht 5' 6.5" (1.689 m)  SpO2 98% Physical Exam  VS noted Constitutional: Pt appears well-developed and well-nourished.  HENT: Head: Normocephalic.  Right Ear: External ear normal.  Left Ear: External ear normal.  Bilat tm's mild erythema.  Sinus tender bilat.  Pharynx mild erythema Eyes: Conjunctivae and EOM are normal. Pupils are equal, round, and reactive to light.  Neck: Normal range of motion. Neck supple.  Cardiovascular: Normal rate and regular rhythm.   Pulmonary/Chest: Effort normal and breath sounds normal.  Neurological: Pt is alert. No cranial nerve deficit.  Skin: Skin is warm. No erythema.  Psychiatric: Pt behavior is normal. Thought content normal.         Assessment & Plan:

## 2011-07-13 NOTE — Telephone Encounter (Signed)
I called Lawson Fiscal at Dr Gaylyn Rong at [phone] office reg: Gwendlyn Deutscher she stated that she didn't called here at [employer]

## 2011-07-13 NOTE — Patient Instructions (Addendum)
Take all new medications as prescribed - you should be able to take the levaquin, even though you cant take avelox if the reaction to avelox was nausea Continue all other medications as before You can also take Delsym OTC for cough, and/or Mucinex (or it's generic off brand) for congestion

## 2011-07-14 ENCOUNTER — Encounter (INDEPENDENT_AMBULATORY_CARE_PROVIDER_SITE_OTHER): Payer: Self-pay | Admitting: Surgery

## 2011-07-30 ENCOUNTER — Encounter (INDEPENDENT_AMBULATORY_CARE_PROVIDER_SITE_OTHER): Payer: Self-pay | Admitting: Surgery

## 2011-08-03 ENCOUNTER — Encounter (INDEPENDENT_AMBULATORY_CARE_PROVIDER_SITE_OTHER): Payer: Self-pay | Admitting: Surgery

## 2011-08-03 ENCOUNTER — Ambulatory Visit (INDEPENDENT_AMBULATORY_CARE_PROVIDER_SITE_OTHER): Payer: BC Managed Care – PPO | Admitting: Surgery

## 2011-08-03 VITALS — BP 114/84 | HR 60 | Temp 98.8°F | Ht 66.0 in | Wt 170.2 lb

## 2011-08-03 DIAGNOSIS — R161 Splenomegaly, not elsewhere classified: Secondary | ICD-10-CM

## 2011-08-03 NOTE — Progress Notes (Signed)
Subjective:     Patient ID: Andrea Bonilla, female   DOB: Apr 19, 1971, 40 y.o.   MRN: [id number]  HPI This is a pleasant 40 year old female who I operated on earlier this year for a benign breast mass. She actually had some abdominal pain earlier this year and during her workup was found to have abnormal-appearing lesions on her spleen. These have been followed with serial scans and MRIs. The differential includes lymphoma versus sarcoid. These have been totally asymptomatic. She has been seeing Dr. Gaylyn Rong. The plane currently is to proceed with a followup scan in November. She is here today to discuss splenectomy as an alternative. Again she has had no further abdominal pain and no symptoms consistent with a lymphoma.  Review of Systems     Objective:   Physical Exam    On exam, she is well-appearing. She is afebrile and her vitals are stable. Her lungs are clear bilaterally. Cardiovascular is regular rate and rhythm. Her abdomen is soft and nontender. There is no organomegaly.  I have reviewed her data from the oncologist office. I have reviewed her scans showing at least a tiny well-circumscribed splenic lesions with no other intra-abdominal pathology. She has also had a normal bone marrow biopsy. Assessment:     Abnormal splenic lesions with splenomegaly    Plan:     I will see her back in late November after her followup CAT scans. If there any changes in the lesion or increase in size of her spleen we'll then consider splenectomy. Unfortunately this will have to be an open procedure versus laparoscopy and the pathologist will need the spleen totally intact.

## 2011-09-03 ENCOUNTER — Encounter: Payer: Self-pay | Admitting: Internal Medicine

## 2011-09-03 ENCOUNTER — Ambulatory Visit (INDEPENDENT_AMBULATORY_CARE_PROVIDER_SITE_OTHER): Payer: BC Managed Care – PPO | Admitting: Internal Medicine

## 2011-09-03 VITALS — BP 90/62 | HR 70 | Temp 98.8°F | Ht 66.0 in | Wt 169.5 lb

## 2011-09-03 DIAGNOSIS — J019 Acute sinusitis, unspecified: Secondary | ICD-10-CM

## 2011-09-03 DIAGNOSIS — J309 Allergic rhinitis, unspecified: Secondary | ICD-10-CM

## 2011-09-03 DIAGNOSIS — J45909 Unspecified asthma, uncomplicated: Secondary | ICD-10-CM

## 2011-09-03 MED ORDER — LEVOFLOXACIN 500 MG PO TABS: 500.0000 mg | ORAL_TABLET | Freq: Every day | ORAL | Status: DC

## 2011-09-03 NOTE — Assessment & Plan Note (Signed)
stable overall by hx and exam, most recent data reviewed with pt, and pt to continue medical treatment as before  SpO2 Readings from Last 3 Encounters:  09/03/11 97%  07/13/11 98%  04/27/11 98%   Continue all other medications as before

## 2011-09-03 NOTE — Assessment & Plan Note (Signed)
Mild to mod, for antibx course,  to f/u any worsening symptoms or concerns 

## 2011-09-03 NOTE — Assessment & Plan Note (Signed)
.  stable overall by hx and exam, most recent data reviewed with pt, and pt to continue medical treatment as before   - ok to re-start the allegra

## 2011-09-03 NOTE — Progress Notes (Signed)
  Subjective:    Patient ID: Andrea Bonilla, female    DOB: 1971-08-28, 40 y.o.   MRN: [id number]  HPI   Here with 3 days acute onset fever, facial pain, pressure, general weakness and malaise, and greenish d/c, with slight ST, but little to no cough and Pt denies chest pain, increased sob or doe, wheezing, orthopnea, PND, increased LE swelling, palpitations, dizziness or syncope.  Does have several wks ongoing nasal allergy symptoms with clear congestion, itch and sneeze, without fever, pain, ST, cough or wheezing.  Pt denies new neurological symptoms such as new headache, or facial or extremity weakness or numbness   Pt denies polydipsia, polyuria. Past Medical History  Diagnosis Date  . ALLERGIC RHINITIS 06/21/2007  . ASTHMA 06/21/2007  . HYPERLIPIDEMIA 06/21/2007  . OSTEOPENIA 03/31/2010  . STRESS FRACTURE, FOOT 03/02/2010  . Asthma   . Splenic lesion    Past Surgical History  Procedure Date  . Skin graft right forehead   . Breast surgery     reports that she has quit smoking. She does not have any smokeless tobacco history on file. She reports that she drinks alcohol. She reports that she does not use illicit drugs. family history includes Coronary artery disease in her other; Diabetes in her other; Heart disease in her mother; Hyperlipidemia in her mother; Hypertension in her mother and other; and Stroke in her other. Allergies  Allergen Reactions  . Clarithromycin     REACTION: nausea  . Codeine   . Erythromycin   . Moxifloxacin     REACTION: nausea   Current Outpatient Prescriptions on File Prior to Visit  Medication Sig Dispense Refill  . fexofenadine (ALLEGRA) 180 MG tablet Take 180 mg by mouth daily.        . Fluticasone-Salmeterol (ADVAIR DISKUS) 250-50 MCG/DOSE AEPB Inhale 1 puff into the lungs 2 (two) times daily.        . norgestimate-ethinyl estradiol (ORTHO-CYCLEN) 0.25-35 MG-MCG per tablet Take 1 tablet by mouth daily.         Review of Systems Review of Systems    Constitutional: Negative for diaphoresis and unexpected weight change.  HENT: Negative for drooling and tinnitus.   Eyes: Negative for photophobia and visual disturbance.  Respiratory: Negative for choking and stridor.   Gastrointestinal: Negative for vomiting and blood in stool.  Genitourinary: Negative for hematuria and decreased urine volume.    Objective:   Physical Exam BP 90/62  Pulse 70  Temp(Src) 98.8 F (37.1 C) (Oral)  Ht 5\' 6"  (1.676 m)  Wt 169 lb 8 oz (76.885 kg)  BMI 27.36 kg/m2  SpO2 97% Physical Exam  VS noted, mild ill Constitutional: Pt appears well-developed and well-nourished.  HENT: Head: Normocephalic.  Right Ear: External ear normal.  Left Ear: External ear normal.  Bilat tm's mild erythema.  Sinus tender bilat.  Pharynx mild erythema Eyes: Conjunctivae and EOM are normal. Pupils are equal, round, and reactive to light.  Neck: Normal range of motion. Neck supple.  Cardiovascular: Normal rate and regular rhythm.   Pulmonary/Chest: Effort normal and breath sounds normal.  Neurological: Pt is alert. No cranial nerve deficit.  Skin: Skin is warm. No erythema.  Psychiatric: Pt behavior is normal. Thought content normal.         Assessment & Plan:

## 2011-09-03 NOTE — Patient Instructions (Signed)
Take all new medications as prescribed Continue all other medications as before  

## 2011-09-06 ENCOUNTER — Other Ambulatory Visit: Payer: Self-pay | Admitting: Internal Medicine

## 2011-09-06 ENCOUNTER — Other Ambulatory Visit (INDEPENDENT_AMBULATORY_CARE_PROVIDER_SITE_OTHER): Payer: BC Managed Care – PPO

## 2011-09-06 DIAGNOSIS — Z0000 Encounter for general adult medical examination without abnormal findings: Secondary | ICD-10-CM

## 2011-09-06 LAB — HEPATIC FUNCTION PANEL
Albumin: 3.5 g/dL (ref 3.5–5.2)
Bilirubin, Direct: 0.1 mg/dL (ref 0.0–0.3)
Total Protein: 6.7 g/dL (ref 6.0–8.3)

## 2011-09-06 LAB — URINALYSIS, ROUTINE W REFLEX MICROSCOPIC
Specific Gravity, Urine: 1.02 (ref 1.000–1.030)
Total Protein, Urine: NEGATIVE
Urine Glucose: NEGATIVE

## 2011-09-06 LAB — CBC WITH DIFFERENTIAL/PLATELET
Basophils Relative: 0.6 % (ref 0.0–3.0)
HCT: 39.3 % (ref 36.0–46.0)
Hemoglobin: 13.3 g/dL (ref 12.0–15.0)
Lymphocytes Relative: 35.6 % (ref 12.0–46.0)
MCHC: 33.8 g/dL (ref 30.0–36.0)
Monocytes Relative: 8.9 % (ref 3.0–12.0)
Neutro Abs: 2.8 10*3/uL (ref 1.4–7.7)
RBC: 4.33 Mil/uL (ref 3.87–5.11)

## 2011-09-06 LAB — LIPID PANEL
Cholesterol: 227 mg/dL — ABNORMAL HIGH (ref 0–200)
HDL: 70.4 mg/dL (ref >39.00)
Total CHOL/HDL Ratio: 3
Triglycerides: 67 mg/dL (ref 0.0–149.0)
VLDL: 13.4 mg/dL (ref 0.0–40.0)

## 2011-09-06 LAB — BASIC METABOLIC PANEL
CO2: 27 mEq/L (ref 19–32)
Calcium: 8.6 mg/dL (ref 8.4–10.5)
Creatinine, Ser: 1 mg/dL (ref 0.4–1.2)
GFR: 62.57 mL/min (ref >60.00)

## 2011-09-06 LAB — LDL CHOLESTEROL, DIRECT: Direct LDL: 159 mg/dL

## 2011-09-13 ENCOUNTER — Ambulatory Visit (INDEPENDENT_AMBULATORY_CARE_PROVIDER_SITE_OTHER): Payer: BC Managed Care – PPO | Admitting: Internal Medicine

## 2011-09-13 ENCOUNTER — Encounter: Payer: Self-pay | Admitting: Internal Medicine

## 2011-09-13 VITALS — BP 104/70 | HR 68 | Temp 98.7°F | Ht 66.5 in | Wt 168.8 lb

## 2011-09-13 DIAGNOSIS — R002 Palpitations: Secondary | ICD-10-CM

## 2011-09-13 DIAGNOSIS — E785 Hyperlipidemia, unspecified: Secondary | ICD-10-CM

## 2011-09-13 DIAGNOSIS — R161 Splenomegaly, not elsewhere classified: Secondary | ICD-10-CM

## 2011-09-13 DIAGNOSIS — Z0000 Encounter for general adult medical examination without abnormal findings: Secondary | ICD-10-CM

## 2011-09-13 DIAGNOSIS — Z23 Encounter for immunization: Secondary | ICD-10-CM

## 2011-09-13 NOTE — Progress Notes (Signed)
Subjective:    Patient ID: Andrea Bonilla, female    DOB: [birth year], 40 y.o.   MRN: [id number]  HPI Here for wellness and f/u;  Overall doing ok;  Pt denies CP, worsening SOB, DOE, wheezing, orthopnea, PND, worsening LE edema, palpitations, dizziness or syncope, except for near daily palpiatations over the past 2-3 wks worse at night before going to bed, described as skipped beats off and on for several minutes at at time, non racing.  Pt denies neurological change such as new Headache, facial or extremity weakness.  Pt denies polydipsia, polyuria, or low sugar symptoms. Pt states overall good compliance with treatment and medications, good tolerability, and trying to follow lower cholesterol diet.  Pt denies worsening depressive symptoms, suicidal ideation or panic. No fever, wt loss, night sweats, loss of appetite, or other constitutional symptoms.  Pt states good ability with ADL's, low fall risk, home safety reviewed and adequate, no significant changes in hearing or vision, and occasionally active with exercise.  Wt peak was approx 191, then lost last yr approx 41 lbs, then regained about 10-15 lbs with being less active with mothers illness.  Now getting into more activity. Past Medical History  Diagnosis Date  . ALLERGIC RHINITIS 06/21/2007  . ASTHMA 06/21/2007  . HYPERLIPIDEMIA 06/21/2007  . OSTEOPENIA 03/31/2010  . STRESS FRACTURE, FOOT 03/02/2010  . Asthma   . Splenic lesion    Past Surgical History  Procedure Date  . Skin graft right forehead   . Breast surgery     reports that she has quit smoking. She does not have any smokeless tobacco history on file. She reports that she drinks alcohol. She reports that she does not use illicit drugs. family history includes Coronary artery disease in her other; Diabetes in her other; Heart disease in her mother; Hyperlipidemia in her mother; Hypertension in her mother and other; and Stroke in her other. Allergies  Allergen Reactions  .  Clarithromycin     REACTION: nausea  . Codeine   . Erythromycin   . Moxifloxacin     REACTION: nausea   Current Outpatient Prescriptions on File Prior to Visit  Medication Sig Dispense Refill  . fexofenadine (ALLEGRA) 180 MG tablet Take 180 mg by mouth daily.        . Fluticasone-Salmeterol (ADVAIR DISKUS) 250-50 MCG/DOSE AEPB Inhale 1 puff into the lungs 2 (two) times daily.        Marland Kitchen levofloxacin (LEVAQUIN) 500 MG tablet Take 1 tablet (500 mg total) by mouth daily.  10 tablet  0  . norgestimate-ethinyl estradiol (ORTHO-CYCLEN) 0.25-35 MG-MCG per tablet Take 1 tablet by mouth daily.         Review of Systems Review of Systems  Constitutional: Negative for diaphoresis, activity change, appetite change and unexpected weight change.  HENT: Negative for hearing loss, ear pain, facial swelling, mouth sores and neck stiffness.   Eyes: Negative for pain, redness and visual disturbance.  Respiratory: Negative for shortness of breath and wheezing.   Cardiovascular: Negative for chest pain and palpitations.  Gastrointestinal: Negative for diarrhea, blood in stool, abdominal distention and rectal pain.  Genitourinary: Negative for hematuria, flank pain and decreased urine volume.  Musculoskeletal: Negative for myalgias and joint swelling.  Skin: Negative for color change and wound.  Neurological: Negative for syncope and numbness.  Hematological: Negative for adenopathy.  Psychiatric/Behavioral: Negative for hallucinations, self-injury, decreased concentration and agitation.     Objective:   Physical Exam BP 104/70  Pulse 68  Temp(Src)  98.7 F (37.1 C) (Oral)  Ht 5' 6.5" (1.689 m)  Wt 168 lb 12 oz (76.544 kg)  BMI 26.83 kg/m2  SpO2 99%  LMP 09/04/2011 Physical Exam  VS noted Constitutional: Pt is oriented to person, place, and time. Appears well-developed and well-nourished.  HENT:  Head: Normocephalic and atraumatic.  Right Ear: External ear normal.  Left Ear: External ear normal.    Nose: Nose normal.  Mouth/Throat: Oropharynx is clear and moist.  Eyes: Conjunctivae and EOM are normal. Pupils are equal, round, and reactive to light.  Neck: Normal range of motion. Neck supple. No JVD present. No tracheal deviation present.  Cardiovascular: Normal rate, regular rhythm, normal heart sounds and intact distal pulses.   Pulmonary/Chest: Effort normal and breath sounds normal.  Abdominal: Soft. Bowel sounds are normal. There is no tenderness.  Musculoskeletal: Normal range of motion. Exhibits no edema.  Lymphadenopathy:  Has no cervical adenopathy.  Neurological: Pt is alert and oriented to person, place, and time. Pt has normal reflexes. No cranial nerve deficit.  Skin: Skin is warm and dry. No rash noted.  Psychiatric:  Has  normal mood and affect. Behavior is normal.     Assessment & Plan:

## 2011-09-13 NOTE — Assessment & Plan Note (Signed)

## 2011-09-13 NOTE — Assessment & Plan Note (Signed)
?   Etiology, for holter monitor, cont same tx for now,  to f/u any worsening symptoms or concerns

## 2011-09-13 NOTE — Assessment & Plan Note (Signed)
Due for f/u CT nov 16, with f/u with Dr Ha/oncology, then surgeon Dr Rayburn Ma after that;  S/p BM biopsy - neg

## 2011-09-13 NOTE — Assessment & Plan Note (Addendum)
D/w pt - for lower chol diet, declines statin at this time

## 2011-09-13 NOTE — Patient Instructions (Signed)
You had the flu shot today Please follow lower cholesterol diet You will be contacted regarding the referral for: 24 hr Holter monitor Continue all other medications as before Please keep your appointments with your specialists as you have planned Please return in 1 year for your yearly visit, or sooner if needed, with Lab testing done 3-5 days before

## 2011-09-29 ENCOUNTER — Encounter (INDEPENDENT_AMBULATORY_CARE_PROVIDER_SITE_OTHER): Payer: BC Managed Care – PPO

## 2011-09-29 DIAGNOSIS — R002 Palpitations: Secondary | ICD-10-CM

## 2011-10-08 ENCOUNTER — Telehealth: Payer: Self-pay

## 2011-10-08 NOTE — Telephone Encounter (Signed)
Pt called requesting the results of the 24 hour Holter monitor, please advise.

## 2011-10-08 NOTE — Telephone Encounter (Signed)
Per cardiology, results have not come back yet. Pt informed that we will contact her with results when we receive them.

## 2011-10-08 NOTE — Telephone Encounter (Signed)
i don't see result on epic.  Please call cardiol and ask

## 2011-10-19 ENCOUNTER — Encounter: Payer: Self-pay | Admitting: *Deleted

## 2011-10-22 ENCOUNTER — Ambulatory Visit (HOSPITAL_COMMUNITY)
Admission: RE | Admit: 2011-10-22 | Discharge: 2011-10-22 | Disposition: A | Discharge status: Home / Self Care | Payer: BC Managed Care – PPO | Source: Ambulatory Visit | Attending: Oncology | Admitting: Oncology

## 2011-10-22 DIAGNOSIS — D739 Disease of spleen, unspecified: Secondary | ICD-10-CM | POA: Insufficient documentation

## 2011-10-22 MED ORDER — IOHEXOL 300 MG/ML  SOLN
100.0000 mL | Freq: Once | INTRAMUSCULAR | Status: AC | PRN
  Administered 2011-10-22: 100 mL via INTRAVENOUS

## 2011-10-25 ENCOUNTER — Telehealth: Payer: Self-pay | Admitting: Oncology

## 2011-10-25 ENCOUNTER — Other Ambulatory Visit: Payer: Self-pay | Admitting: Oncology

## 2011-10-25 ENCOUNTER — Other Ambulatory Visit (HOSPITAL_BASED_OUTPATIENT_CLINIC_OR_DEPARTMENT_OTHER): Payer: BC Managed Care – PPO | Admitting: Lab

## 2011-10-25 ENCOUNTER — Ambulatory Visit (HOSPITAL_BASED_OUTPATIENT_CLINIC_OR_DEPARTMENT_OTHER): Payer: BC Managed Care – PPO | Admitting: Oncology

## 2011-10-25 VITALS — BP 136/62 | HR 78 | Temp 98.6°F | Ht 66.5 in | Wt 174.4 lb

## 2011-10-25 DIAGNOSIS — D739 Disease of spleen, unspecified: Secondary | ICD-10-CM

## 2011-10-25 LAB — CBC WITH DIFFERENTIAL/PLATELET
BASO%: 0.4 % (ref 0.0–2.0)
LYMPH%: 27.4 % (ref 14.0–49.7)
MCHC: 34.4 g/dL (ref 31.5–36.0)
MCV: 88.4 fL (ref 79.5–101.0)
MONO#: 0.4 10*3/uL (ref 0.1–0.9)
MONO%: 7.3 % (ref 0.0–14.0)
Platelets: 167 10*3/uL (ref 145–400)
RBC: 4.14 10*6/uL (ref 3.70–5.45)
WBC: 4.9 10*3/uL (ref 3.9–10.3)

## 2011-10-25 LAB — COMPREHENSIVE METABOLIC PANEL
ALT: 11 U/L (ref 0–35)
Alkaline Phosphatase: 51 U/L (ref 39–117)
Sodium: 137 mEq/L (ref 135–145)
Total Bilirubin: 0.4 mg/dL (ref 0.3–1.2)
Total Protein: 6.2 g/dL (ref 6.0–8.3)

## 2011-10-25 NOTE — Progress Notes (Signed)
[facility] OFFICE PROGRESS NOTE  Oliver Barre, MD, MD  DIAGNOSIS:  Asymptomatic splenic lesions; NOS; negative work up with pan CT, bone marrow biopsy, low ACE level making sarcoidosis less likely.  CURRENT THERAPY:  watchful observation.  INTERVAL HISTORY: Andrea Bonilla 40 y.o. female returns for regular follow up.  She has been avoiding nuts; and has not had any more abdominal pain.  She is working full time.  She has good appetite and no weight loss. She denies fever, headache, visual changes, mucositis, nausea vomiting, chest pain, palpitation, abdominal pain, abdominal swelling, hematemesis, hematochezia, melena, hematuria, palpable lymph node swelling, back pain.   MEDICAL HISTORY: Past Medical History  Diagnosis Date  . ALLERGIC RHINITIS 06/21/2007  . ASTHMA 06/21/2007  . HYPERLIPIDEMIA 06/21/2007  . OSTEOPENIA 03/31/2010  . STRESS FRACTURE, FOOT 03/02/2010  . Asthma   . Splenic lesion     SURGICAL HISTORY:  Past Surgical History  Procedure Date  . Skin graft right forehead   . Breast surgery     MEDICATIONS: Current Outpatient Prescriptions  Medication Sig Dispense Refill  . Calcium Carbonate-Vitamin D (CALCIUM-VITAMIN D) 500-200 MG-UNIT per tablet Take 1 tablet by mouth 2 (two) times daily with a meal.        . Cholecalciferol (VITAMIN D3) 1000 UNITS CAPS Take 1 capsule by mouth daily.        . Cyanocobalamin (VITAMIN B 12 PO) Take 500 mg by mouth daily.        . fexofenadine (ALLEGRA) 180 MG tablet Take 180 mg by mouth daily.        . Fluticasone-Salmeterol (ADVAIR DISKUS) 250-50 MCG/DOSE AEPB Inhale 1 puff into the lungs 2 (two) times daily.        . Multiple Vitamin (MULTIVITAMIN) tablet Take 1 tablet by mouth daily.        . norgestimate-ethinyl estradiol (ORTHO-CYCLEN) 0.25-35 MG-MCG per tablet Take 1 tablet by mouth daily.          ALLERGIES:  is allergic to clarithromycin; codeine; erythromycin; and moxifloxacin.  REVIEW OF SYSTEMS:  The rest of  the 14-point review of system was negative.   Filed Vitals:   10/25/11 0934  BP: 136/62  Pulse: 78  Temp: 98.6 F (37 C)   Wt Readings from Last 3 Encounters:  10/25/11 174 lb 6.4 oz (79.107 kg)  06/24/11 165 lb 3.2 oz (74.934 kg)  09/13/11 168 lb 12 oz (76.544 kg)   ECOG Performance status: 0.  PHYSICAL EXAMINATION:   General:  well-nourished in no acute distress.  Eyes:  no scleral icterus.  ENT:  There were no oropharyngeal lesions.  Neck was without thyromegaly.  Lymphatics:  Negative cervical, supraclavicular or axillary adenopathy.  Respiratory: lungs were clear bilaterally without wheezing or crackles.  Cardiovascular:  Regular rate and rhythm, S1/S2, without murmur, rub or gallop.  There was no pedal edema.  GI:  abdomen was soft, flat, nontender, nondistended, without organomegaly.  Muscoloskeletal:  no spinal tenderness of palpation of vertebral spine.  Skin exam was without echymosis, petichae.  Neuro exam was nonfocal.  Patient was able to get on and off exam table without assistance.  Gait was normal.  Patient was alerted and oriented.  Attention was good.   Language was appropriate.  Mood was normal without depression.  Speech was not pressured.  Thought content was not tangential.    LABORATORY/RADIOLOGY DATA:  Lab Results  Component Value Date   WBC 4.9 10/25/2011   HGB 12.6 10/25/2011  HCT 36.6 10/25/2011   PLT 167 10/25/2011   GLUCOSE 78 09/06/2011   CHOL 227* 09/06/2011   TRIG 67.0 09/06/2011   HDL 70.40 09/06/2011   LDLDIRECT 159.0 09/06/2011   ALT 14 09/06/2011   AST 18 09/06/2011   NA 138 09/06/2011   K 3.8 09/06/2011   CL 104 09/06/2011   CREATININE 1.0 09/06/2011   BUN 14 09/06/2011   CO2 27 09/06/2011   TSH 1.75 09/06/2011   INR 0.89 06/17/2011    Ct Abdomen W Contrast:  I personally reviewed the CT abdomen images and showed the pictures to the patient and her husband and mother.   10/22/2011  *RADIOLOGY REPORT*  Clinical Data:  Follow up of splenic lesions.   CT ABDOMEN  WITH CONTRAST  Technique: Multidetector CT imaging of the abdomen was performed following the standard protocol following the bolus administration of intravenous contrast.  Contrast: 100  ml Omnipaque-300  Comparison: CT of the 06/03/2011.  MRI of 05/10/2011.  CT of 01/17/2006.  Findings:  Clear lung bases. Normal heart size without pericardial or pleural effusion.  Normal liver.  The splenic lesions are less apparent today than on the prior exam.  This difference may be partially due to bolus timing.  For instance a subtle area of hypoattenuation measures  2.2 cm on image 22 versus 2.3 cm on the prior CT and 2.3 cm on the prior MRI.  No splenomegaly or perisplenic fluid.  Normal stomach, pancreas, gallbladder, biliary tract, adrenal glands, kidneys. No retroperitoneal or retrocrural adenopathy.  Normal abdominal small bowel without ascites.  Mild disc bulges at L2-L3 and L4-L5.  IMPRESSION:  1.  Splenic lesions are less conspicuous today.  This may be partially due to differences in bolus timing.  No evidence of progression.  Given the T2 hypointensity on the 05/10/2011 MRI, sarcoidosis could have this appearance (T2 hypointensity is relatively specific for sarcoidosis).  Other explanations, including a hemangiomas or hamartomas possible.  Consider ultrasound surveillance at approximately 6 months to confirm ongoing stability. 2. No acute abdominal process.  Original Report Authenticated By: Consuello Bossier, M.D.       ASSESSMENT AND PLAN:    Splenic lesions:  I discussed with Andrea Bonilla and family again that the splenic lesions are most likely hemangiomas.  There is no progression or more lesion on this latest CT.  I recommended watchful observation.  Per recommendation by radiologist, I requested a follow up US spleen in 6 months to assess stability.  I advised her to contact my clinic if she has B symptoms.  She and her family members expressed informed understanding and agreed with the stated  plan.

## 2011-10-25 NOTE — Telephone Encounter (Signed)
gv pt appt schedule for may and [place] appt for 5/1 @ 9 am @ [facility].

## 2011-10-26 ENCOUNTER — Encounter (INDEPENDENT_AMBULATORY_CARE_PROVIDER_SITE_OTHER): Payer: BC Managed Care – PPO | Admitting: Surgery

## 2011-11-07 IMAGING — MG MM DIGITAL SCREENING
4 series · 4 of 4 positions shown · non-contrast
Comparison: none

DG SCREEN MAMMOGRAM BILATERAL
Bilateral CC and MLO view(s) were taken.

DIGITAL SCREENING MAMMOGRAM WITH CAD:
The breast tissue is heterogeneously dense.  No masses or malignant type calcifications are 
identified.  Compared with prior studies.
Images were processed with CAD.

[R CC]
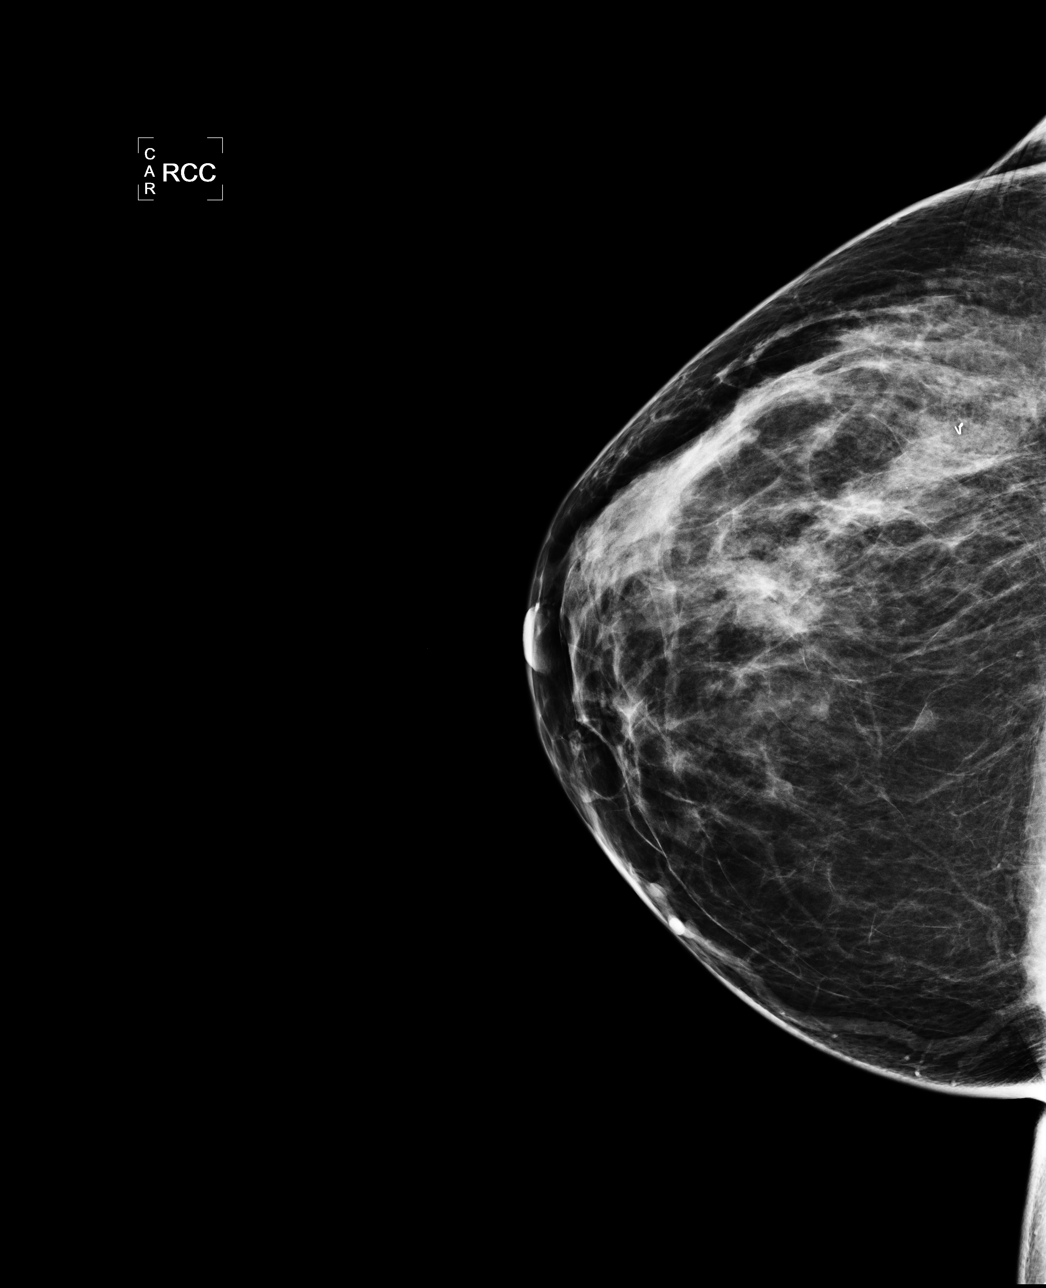

[L CC]
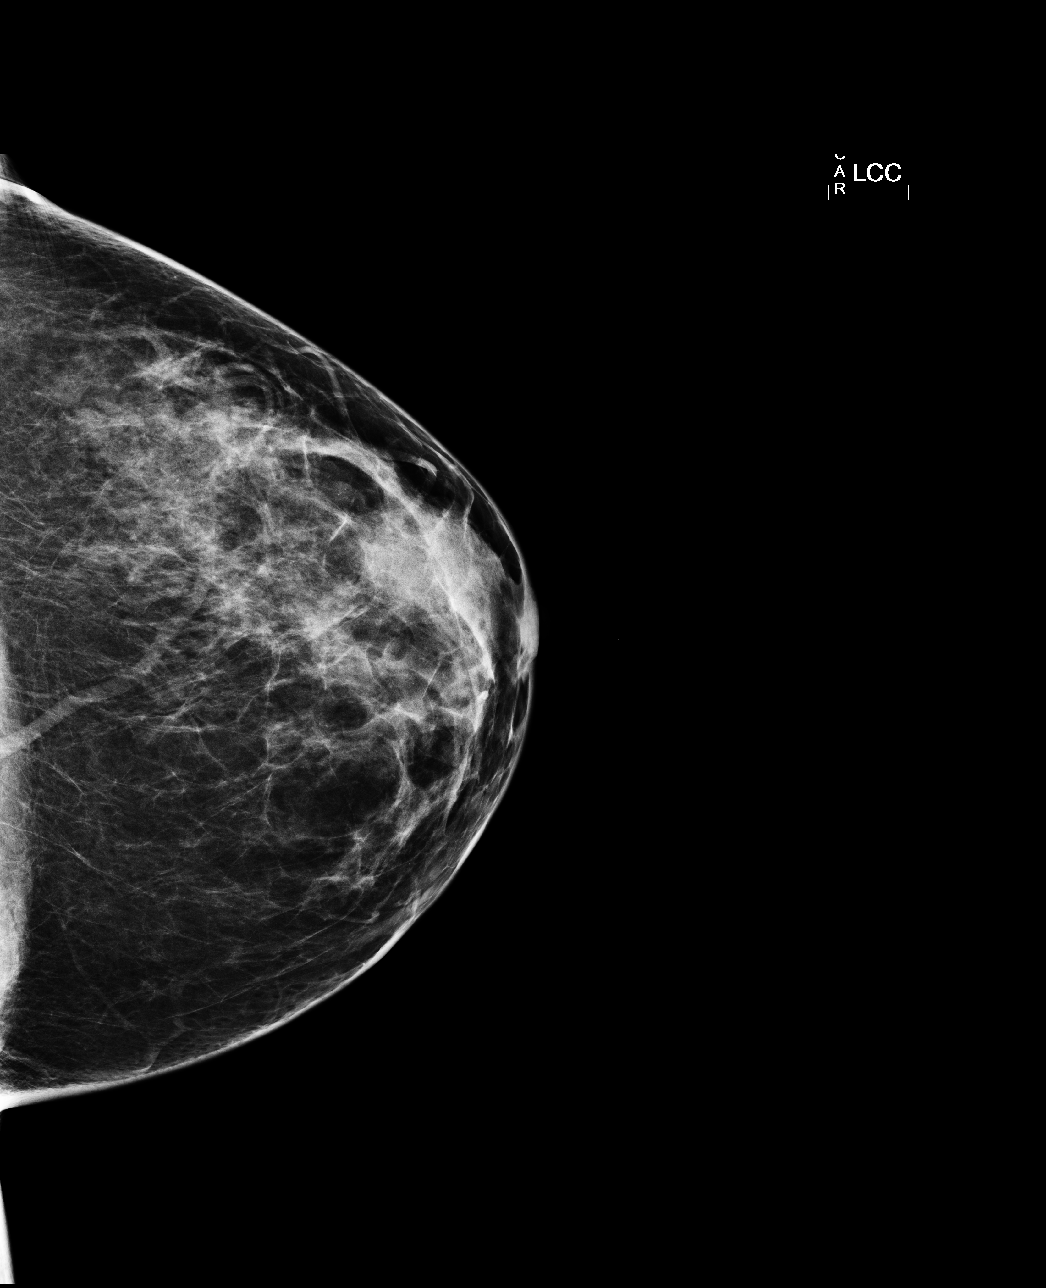

[L MLO]
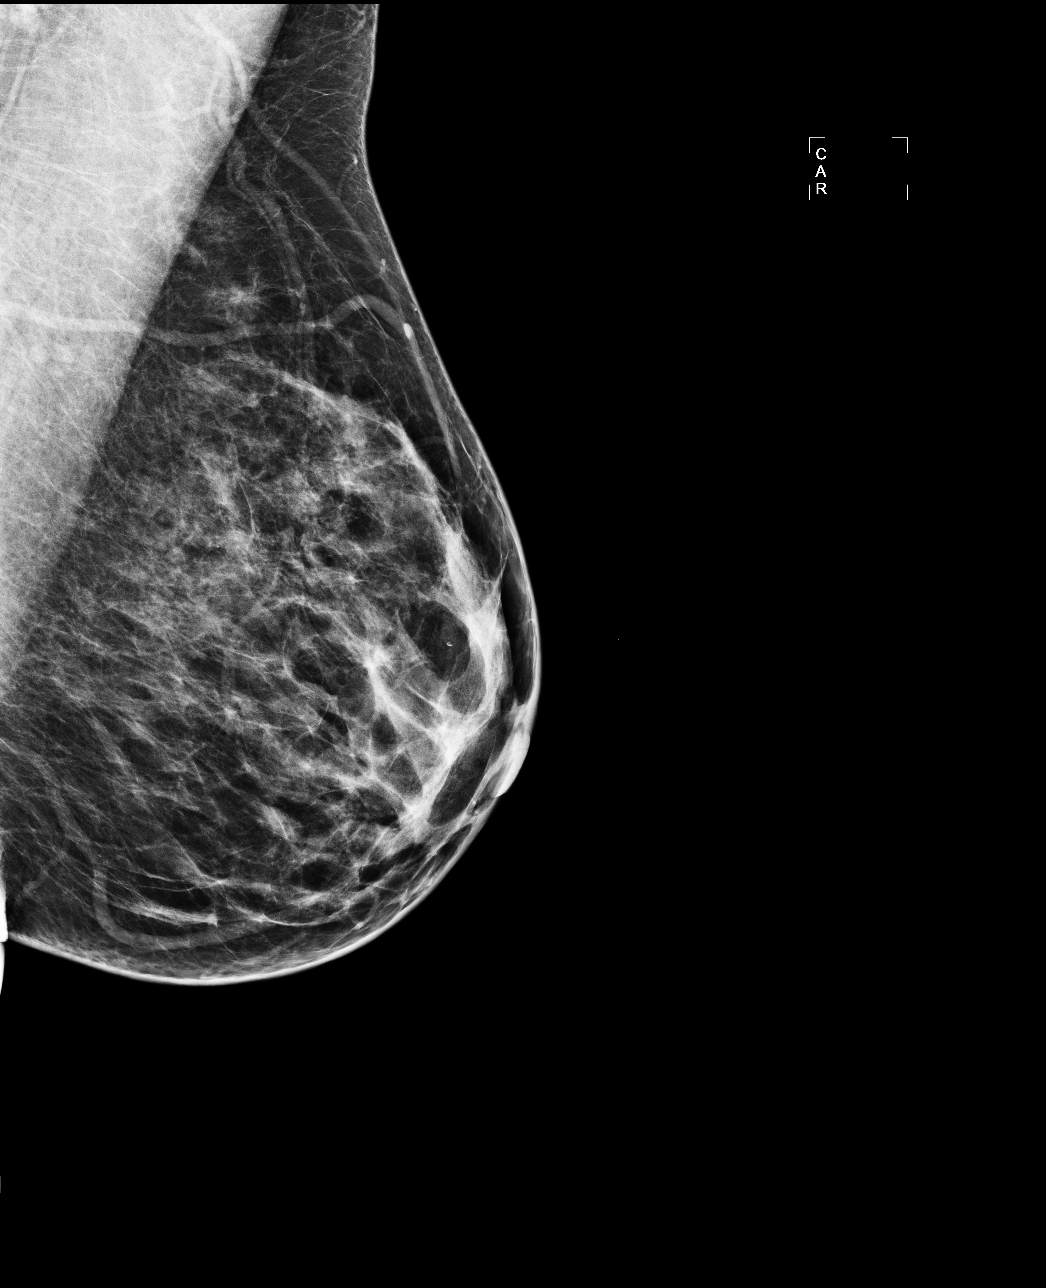

[R MLO]
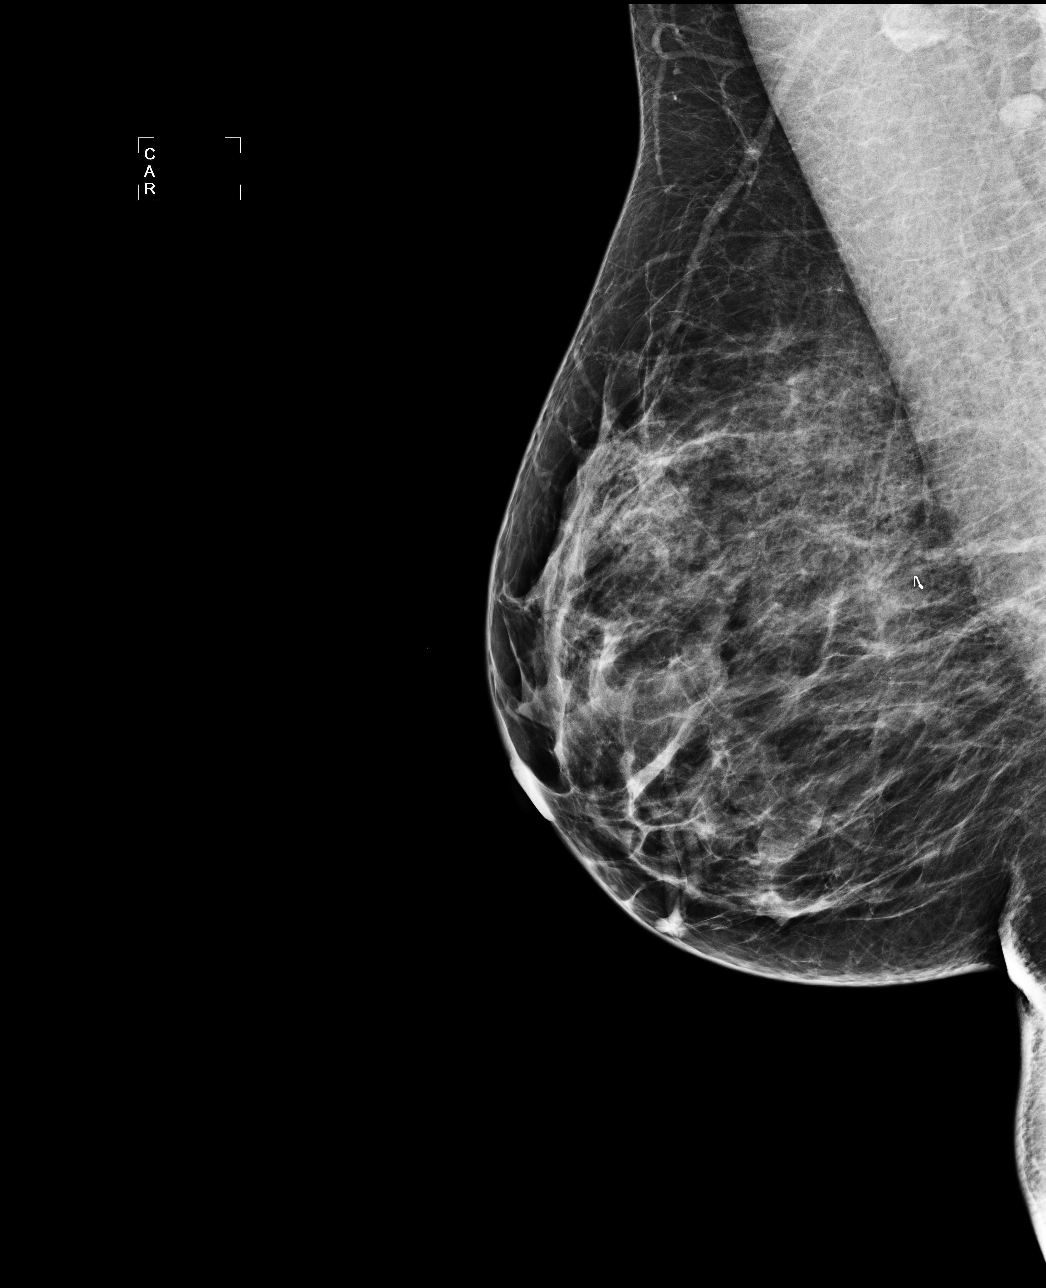

[4 of 4 positions shown; findings below may reference images not displayed]

IMPRESSION: No specific mammographic evidence of malignancy.  Next screening mammogram is recommended in one 
year.

A result letter of this screening mammogram will be mailed directly to the patient.

ASSESSMENT: Negative - BI-RADS 1

Screening mammogram in 1 year.
,

## 2011-12-24 ENCOUNTER — Other Ambulatory Visit: Payer: Self-pay

## 2011-12-24 MED ORDER — FLUTICASONE-SALMETEROL 250-50 MCG/DOSE IN AEPB: 1.0000 | INHALATION_SPRAY | Freq: Two times a day (BID) | RESPIRATORY_TRACT | Status: DC

## 2012-01-17 ENCOUNTER — Ambulatory Visit (INDEPENDENT_AMBULATORY_CARE_PROVIDER_SITE_OTHER): Payer: BC Managed Care – PPO | Admitting: Internal Medicine

## 2012-01-17 ENCOUNTER — Encounter: Payer: Self-pay | Admitting: Internal Medicine

## 2012-01-17 VITALS — BP 110/72 | HR 61 | Temp 98.5°F | Ht 66.0 in | Wt 177.1 lb

## 2012-01-17 DIAGNOSIS — J209 Acute bronchitis, unspecified: Secondary | ICD-10-CM

## 2012-01-17 MED ORDER — CEPHALEXIN 500 MG PO CAPS: 500.0000 mg | ORAL_CAPSULE | Freq: Four times a day (QID) | ORAL | Status: AC

## 2012-01-17 MED ORDER — HYDROCOD POLST-CHLORPHEN POLST 10-8 MG/5ML PO LQCR: 5.0000 mL | Freq: Two times a day (BID) | ORAL | Status: DC | PRN

## 2012-01-17 NOTE — Progress Notes (Signed)
  Subjective:    Patient ID: Andrea Bonilla, female    DOB: 1970/12/26, 41 y.o.   MRN: [id number]  HPI  Here with acute onset mild to mod 2-3 days ST, HA, general weakness and malaise, with prod cough greenish sputum, but Pt denies chest pain, increased sob or doe, wheezing, orthopnea, PND, increased LE swelling, palpitations, dizziness or syncope.  Pt denies new neurological symptoms such as new headache, or facial or extremity weakness or numbness   Pt denies polydipsia, polyuria.  Traveling to [place] next wk on business. Recent ongoing eval per Dr Gaylyn Rong seems to indicate splenic spont improving.   Past Medical History  Diagnosis Date  . ALLERGIC RHINITIS 06/21/2007  . ASTHMA 06/21/2007  . HYPERLIPIDEMIA 06/21/2007  . OSTEOPENIA 03/31/2010  . STRESS FRACTURE, FOOT 03/02/2010  . Asthma   . Splenic lesion    Past Surgical History  Procedure Date  . Skin graft right forehead   . Breast surgery     reports that she has quit smoking. She does not have any smokeless tobacco history on file. She reports that she drinks alcohol. She reports that she does not use illicit drugs. family history includes Coronary artery disease in her other; Diabetes in her other; Heart disease in her mother; Hyperlipidemia in her mother; Hypertension in her mother and other; and Stroke in her other. Allergies  Allergen Reactions  . Clarithromycin     REACTION: nausea  . Codeine   . Erythromycin   . Moxifloxacin     REACTION: nausea   Current Outpatient Prescriptions on File Prior to Visit  Medication Sig Dispense Refill  . Calcium Carbonate-Vitamin D (CALCIUM-VITAMIN D) 500-200 MG-UNIT per tablet Take 1 tablet by mouth 2 (two) times daily with a meal.        . Cholecalciferol (VITAMIN D3) 1000 UNITS CAPS Take 1 capsule by mouth daily.        . Cyanocobalamin (VITAMIN B 12 PO) Take 500 mg by mouth daily.        . fexofenadine (ALLEGRA) 180 MG tablet Take 180 mg by mouth daily.        . Fluticasone-Salmeterol (ADVAIR  DISKUS) 250-50 MCG/DOSE AEPB Inhale 1 puff into the lungs 2 (two) times daily.  60 each  3  . Multiple Vitamin (MULTIVITAMIN) tablet Take 1 tablet by mouth daily.        . norgestimate-ethinyl estradiol (ORTHO-CYCLEN) 0.25-35 MG-MCG per tablet Take 1 tablet by mouth daily.         Review of Systems All otherwise neg per pt    Objective:   Physical Exam BP 110/72  Pulse 61  Temp(Src) 98.5 F (36.9 C) (Oral)  Ht 5\' 6"  (1.676 m)  Wt 177 lb 2 oz (80.343 kg)  BMI 28.59 kg/m2  SpO2 97% Physical Exam  VS noted Constitutional: Pt appears well-developed and well-nourished.  HENT: Head: Normocephalic.  Right Ear: External ear normal.  Left Ear: External ear normal.  Eyes: Conjunctivae and EOM are normal. Pupils are equal, round, and reactive to light.  Neck: Normal range of motion. Neck supple.  \Bilat tm's mild erythema.  Sinus nontender.  Pharynx mild erythema Cardiovascular: Normal rate and regular rhythm.   Pulmonary/Chest: Effort normal and breath sounds normal.  Psychiatric: Pt behavior is normal. Thought content normal.     Assessment & Plan:

## 2012-01-17 NOTE — Assessment & Plan Note (Signed)
Mild to mod, for antibx course,  to f/u any worsening symptoms or concerns 

## 2012-01-17 NOTE — Patient Instructions (Signed)
Take all new medications as prescribed Continue all other medications as before  

## 2012-01-25 ENCOUNTER — Telehealth: Payer: Self-pay

## 2012-01-25 MED ORDER — FLUCONAZOLE 150 MG PO TABS: ORAL_TABLET | ORAL | Status: DC

## 2012-01-25 NOTE — Telephone Encounter (Signed)
Called left message that prescription requested has been sent to pharmacy.

## 2012-01-25 NOTE — Telephone Encounter (Signed)
Pt called requesting Rx for Diflucan, please advise 

## 2012-01-25 NOTE — Telephone Encounter (Signed)
Done escript 

## 2012-02-08 ENCOUNTER — Ambulatory Visit (INDEPENDENT_AMBULATORY_CARE_PROVIDER_SITE_OTHER): Payer: BC Managed Care – PPO | Admitting: Internal Medicine

## 2012-02-08 ENCOUNTER — Encounter: Payer: Self-pay | Admitting: Internal Medicine

## 2012-02-08 VITALS — BP 102/68 | HR 75 | Temp 98.6°F

## 2012-02-08 DIAGNOSIS — J45901 Unspecified asthma with (acute) exacerbation: Secondary | ICD-10-CM

## 2012-02-08 DIAGNOSIS — J069 Acute upper respiratory infection, unspecified: Secondary | ICD-10-CM

## 2012-02-08 MED ORDER — METHYLPREDNISOLONE ACETATE 80 MG/ML IJ SUSP
120.0000 mg | Freq: Once | INTRAMUSCULAR | Status: AC
  Administered 2012-02-08: 120 mg via INTRAMUSCULAR

## 2012-02-08 MED ORDER — AMOXICILLIN-POT CLAVULANATE 875-125 MG PO TABS: 1.0000 | ORAL_TABLET | Freq: Two times a day (BID) | ORAL | Status: AC

## 2012-02-08 NOTE — Patient Instructions (Signed)
It was good to see you today. Augmentin antibiotics - Your prescription(s) have been submitted to your pharmacy. Please take as directed and contact our office if you believe you are having problem(s) with the medication(s). Steroid shot (medrol) given today - continue other asthma and allergy medications as ongoing and reviewed today Alternate between ibuprofen and tylenol for aches, pain and fever symptoms as discussed Followup in 7-10 days if symptoms unimproved, sooner if worse

## 2012-02-08 NOTE — Progress Notes (Signed)
  Subjective:    HPI  complains of "chest cold" symptoms - cough mostly nonproductive and worse at night Onset >1 week ago, progressive symptoms  No myalgias, sinus pressure or sinus/nasal congestion mild relief with OTC meds Precipitated by travel overseas and sick contacts  Past Medical History  Diagnosis Date  . ALLERGIC RHINITIS   . ASTHMA   . HYPERLIPIDEMIA   . OSTEOPENIA   . STRESS FRACTURE, FOOT   . Splenic lesion     Review of Systems Constitutional: No night sweats, no unexpected weight change Pulmonary: No pleurisy or hemoptysis Cardiovascular: No chest pain or palpitations     Objective:   Physical Exam BP 102/68  Pulse 75  Temp(Src) 98.6 F (37 C) (Oral)  SpO2 99% GEN: nontoxic appearing - coughing, dry spasms HENT: NCAT, mild sinus tenderness bilaterally, nares with clear discharge, oropharynx mild erythema, no exudate Eyes: Vision grossly intact, no conjunctivitis Lungs: Clear to auscultation without rhonchi - but mild end exp wheeze, no increased work of breathing at rest but cough on deep inspiration Cardiovascular: Regular rate and rhythm, no bilateral edema      Assessment & Plan:  Viral URI with cough Asthmatic bronchitis   IM medrol for cough and bronchospasm today Empiric antibiotics prescribed due to symptom duration greater than 7 days - augmentin Prescription cough suppression - tussionex at home Continue usual asthma and allergy meds Symptomatic care with Tylenol or Advil, hydration and rest -  salt gargle advised as needed

## 2012-02-25 ENCOUNTER — Other Ambulatory Visit: Payer: Self-pay | Admitting: Obstetrics and Gynecology

## 2012-02-25 DIAGNOSIS — Z1231 Encounter for screening mammogram for malignant neoplasm of breast: Secondary | ICD-10-CM

## 2012-03-27 ENCOUNTER — Ambulatory Visit
Admission: RE | Admit: 2012-03-27 | Discharge: 2012-03-27 | Disposition: A | Discharge status: Home / Self Care | Payer: BC Managed Care – PPO | Source: Ambulatory Visit | Attending: Obstetrics and Gynecology | Admitting: Obstetrics and Gynecology

## 2012-03-27 DIAGNOSIS — Z1231 Encounter for screening mammogram for malignant neoplasm of breast: Secondary | ICD-10-CM

## 2012-04-05 ENCOUNTER — Ambulatory Visit (HOSPITAL_COMMUNITY)
Admission: RE | Admit: 2012-04-05 | Discharge: 2012-04-05 | Disposition: A | Discharge status: Home / Self Care | Payer: BC Managed Care – PPO | Source: Ambulatory Visit | Attending: Oncology | Admitting: Oncology

## 2012-04-05 DIAGNOSIS — K7689 Other specified diseases of liver: Secondary | ICD-10-CM | POA: Insufficient documentation

## 2012-04-05 DIAGNOSIS — D739 Disease of spleen, unspecified: Secondary | ICD-10-CM | POA: Insufficient documentation

## 2012-04-07 ENCOUNTER — Ambulatory Visit (HOSPITAL_BASED_OUTPATIENT_CLINIC_OR_DEPARTMENT_OTHER): Payer: BC Managed Care – PPO | Admitting: Oncology

## 2012-04-07 ENCOUNTER — Ambulatory Visit: Payer: BC Managed Care – PPO | Admitting: Oncology

## 2012-04-07 ENCOUNTER — Telehealth: Payer: Self-pay | Admitting: Oncology

## 2012-04-07 ENCOUNTER — Encounter: Payer: Self-pay | Admitting: Oncology

## 2012-04-07 ENCOUNTER — Other Ambulatory Visit (HOSPITAL_BASED_OUTPATIENT_CLINIC_OR_DEPARTMENT_OTHER): Payer: BC Managed Care – PPO | Admitting: Lab

## 2012-04-07 VITALS — BP 110/64 | HR 82 | Temp 97.6°F | Ht 66.0 in | Wt 176.3 lb

## 2012-04-07 DIAGNOSIS — D7389 Other diseases of spleen: Secondary | ICD-10-CM | POA: Insufficient documentation

## 2012-04-07 DIAGNOSIS — D739 Disease of spleen, unspecified: Secondary | ICD-10-CM

## 2012-04-07 LAB — CBC WITH DIFFERENTIAL/PLATELET
Basophils Absolute: 0 10*3/uL (ref 0.0–0.1)
EOS%: 1.6 % (ref 0.0–7.0)
HCT: 37.7 % (ref 34.8–46.6)
HGB: 12.8 g/dL (ref 11.6–15.9)
MCH: 30.6 pg (ref 25.1–34.0)
MCV: 89.9 fL (ref 79.5–101.0)
MONO%: 8.4 % (ref 0.0–14.0)
NEUT%: 61.1 % (ref 38.4–76.8)
Platelets: 183 10*3/uL (ref 145–400)

## 2012-04-07 LAB — COMPREHENSIVE METABOLIC PANEL
Albumin: 3.7 g/dL (ref 3.5–5.2)
Alkaline Phosphatase: 49 U/L (ref 39–117)
BUN: 20 mg/dL (ref 6–23)
Glucose, Bld: 101 mg/dL — ABNORMAL HIGH (ref 70–99)
Potassium: 4.4 mEq/L (ref 3.5–5.3)

## 2012-04-07 NOTE — Progress Notes (Signed)
[facility]  Telephone:([phone]) [phone] Fax:([phone]) [phone]   OFFICE PROGRESS NOTE   Cc:  Oliver Barre, MD, MD  DIAGNOSIS: Asymptomatic splenic lesions; NOS; negative work up with pan CT, bone marrow biopsy, low ACE level making sarcoidosis less likely.   CURRENT THERAPY: watchful observation.  INTERVAL HISTORY: Andrea Bonilla 41 y.o. female returns for a routine visit today. She has not had abdominal pain. She is working full time. Mild fatigue, but unchanged from baseline. Patient thinks this is due to work schedule. She has good appetite and no weight loss. She denies fever, headache, visual changes, mucositis, nausea vomiting, chest pain, palpitation, abdominal pain, abdominal swelling, hematemesis, hematochezia, melena, hematuria, palpable lymph node swelling, back pain.    Past Medical History  Diagnosis Date  . ALLERGIC RHINITIS   . ASTHMA   . HYPERLIPIDEMIA   . OSTEOPENIA   . STRESS FRACTURE, FOOT   . Splenic lesion     Past Surgical History  Procedure Date  . Skin graft right forehead   . Breast surgery     Current Outpatient Prescriptions  Medication Sig Dispense Refill  . Calcium Carbonate-Vitamin D (CALCIUM-VITAMIN D) 500-200 MG-UNIT per tablet Take 1 tablet by mouth 2 (two) times daily with a meal.        . chlorpheniramine-HYDROcodone (TUSSIONEX PENNKINETIC ER) 10-8 MG/5ML LQCR Take 5 mLs by mouth every 12 (twelve) hours as needed.  140 mL  1  . Cholecalciferol (VITAMIN D3) 1000 UNITS CAPS Take 1 capsule by mouth daily.        . Cyanocobalamin (VITAMIN B 12 PO) Take 500 mg by mouth daily.        . fexofenadine (ALLEGRA) 180 MG tablet Take 180 mg by mouth daily.        . Fluticasone-Salmeterol (ADVAIR DISKUS) 250-50 MCG/DOSE AEPB Inhale 1 puff into the lungs 2 (two) times daily.  60 each  3  . Multiple Vitamin (MULTIVITAMIN) tablet Take 1 tablet by mouth daily.        . norgestimate-ethinyl estradiol (ORTHO-CYCLEN) 0.25-35 MG-MCG per tablet Take 1  tablet by mouth daily.          ALLERGIES:  is allergic to clarithromycin; codeine; erythromycin; and moxifloxacin.  REVIEW OF SYSTEMS:  The rest of the 14-point review of system was negative.   There were no vitals filed for this visit. Wt Readings from Last 3 Encounters:  01/17/12 177 lb 2 oz (80.343 kg)  10/25/11 174 lb 6.4 oz (79.107 kg)  06/24/11 165 lb 3.2 oz (74.934 kg)   ECOG Performance status: 0  PHYSICAL EXAMINATION: General:  well-nourished in no acute distress.  Eyes:  no scleral icterus.  ENT:  There were no oropharyngeal lesions.  Neck was without thyromegaly.  Lymphatics:  Negative cervical, supraclavicular or axillary adenopathy.  Respiratory: lungs were clear bilaterally without wheezing or crackles.  Cardiovascular:  Regular rate and rhythm, S1/S2, without murmur, rub or gallop.  There was no pedal edema.  GI:  abdomen was soft, flat, nontender, nondistended, without organomegaly.  Muscoloskeletal:  no spinal tenderness of palpation of vertebral spine.  Skin exam was without echymosis, petichae.  Neuro exam was nonfocal.  Patient was able to get on and off exam table without assistance.  Gait was normal.  Patient was alerted and oriented.  Attention was good.   Language was appropriate.  Mood was normal without depression.  Speech was not pressured.  Thought content was not tangential.    LABORATORY/RADIOLOGY DATA:  Lab  Results  Component Value Date   WBC 5.4 04/07/2012   HGB 12.8 04/07/2012   HCT 37.7 04/07/2012   PLT 183 04/07/2012   GLUCOSE 87 10/25/2011   GLUCOSE 87 10/25/2011   CHOL 227* 09/06/2011   TRIG 67.0 09/06/2011   HDL 70.40 09/06/2011   LDLDIRECT 159.0 09/06/2011   ALKPHOS 51 10/25/2011   ALKPHOS 51 10/25/2011   ALT 11 10/25/2011   ALT 11 10/25/2011   AST 17 10/25/2011   AST 17 10/25/2011   NA 137 10/25/2011   NA 137 10/25/2011   K 4.4 10/25/2011   K 4.4 10/25/2011   CL 103 10/25/2011   CL 103 10/25/2011   CREATININE 0.91 10/25/2011   CREATININE 0.91  10/25/2011   BUN 10 10/25/2011   BUN 10 10/25/2011   CO2 27 10/25/2011   CO2 27 10/25/2011   INR 0.89 06/17/2011    US Abdomen Complete  04/05/2012  *RADIOLOGY REPORT*  Clinical Data:  Follow-up splenic lesion  ABDOMINAL ULTRASOUND COMPLETE  Comparison:  None.  Findings:  Gallbladder:  No gallstones, gallbladder wall thickening, or pericholecystic fluid.  Common Bile Duct:  Within normal limits in caliber.  Liver: Tiny 4 mm x 5 mm x 4 mm hyperechoic focus in the anterior segment of the right lobe of the liver is of unknown significance. This was not evident on prior CT imaging.  Otherwise, the liver is within normal limits.  IVC:  Appears normal.  Pancreas:  No abnormality identified.  Spleen:  Multiple splenic lesions are identified.  The largest is 2.7 x 2.3 x 2.3 cm.  Right kidney:  Normal in size and parenchymal echogenicity.  No evidence of mass or hydronephrosis.  Left kidney:  Normal in size and parenchymal echogenicity.  No evidence of mass or hydronephrosis.  Abdominal Aorta:  No aneurysm identified.  IMPRESSION: Multiple splenic lesions.  The largest is 2.7 cm.  Direct comparison with CT would be more accurate to assess for progression of disease.  Tiny 5 mm hyper echoic focus in the liver of of unknown significance.This was not evident on prior imaging of the liver by CT.  Original Report Authenticated By: Donavan Burnet, M.D.   Mm Digital Screening  03/28/2012  DG SCREEN MAMMOGRAM BILATERAL Bilateral CC and MLO view(s) were taken.  DIGITAL SCREENING MAMMOGRAM WITH CAD: The breast tissue is heterogeneously dense.  No masses or malignant type calcifications are  identified.  Compared with prior studies.  Images were processed with CAD.  IMPRESSION: No specific mammographic evidence of malignancy.  Next screening mammogram is recommended in one  year.  A result letter of this screening mammogram will be mailed directly to the patient.  ASSESSMENT: Negative - BI-RADS 1  Screening mammogram in 1  year. ,   ASSESSMENT AND PLAN:  1. Splenic lesions: Splenic lesions remain stable on ultrasound. CBC normal. CMET and LDH pending today. Lesions may be inflammatory. She has no B symptoms at this time. I advised her to contact my clinic if she has B symptoms.  2. Follow-up: in 6 months for labs only. She will return in 1 year with an ultrasound prior to her visit.   The patient was seen and examined with Dr Gaylyn Rong. >90% of plan of care developed by Dr Gaylyn Rong. The length of time of the face-to-face encounter was 15 minutes. More than 50% of time was spent counseling and coordination of care.

## 2012-04-07 NOTE — Telephone Encounter (Signed)
appts made and printed for pt aom °

## 2012-05-22 IMAGING — US UNKNOWN US STUDY
1 series · 9 of 9 positions shown · non-contrast
Comparison: 03/23/2010 and 03/20/2009

CLINICAL DATA: Patient presents for a diagnostic left breast
mammogram due to a palpable abnormality over the upper outer left
breast for 5 weeks.

DIGITAL DIAGNOSTIC LEFT MAMMOGRAM WITH CAD AND LEFT BREAST
ULTRASOUND:

[Series 1: unknown us study · 9 of 9 slices shown]
[im 1/9]
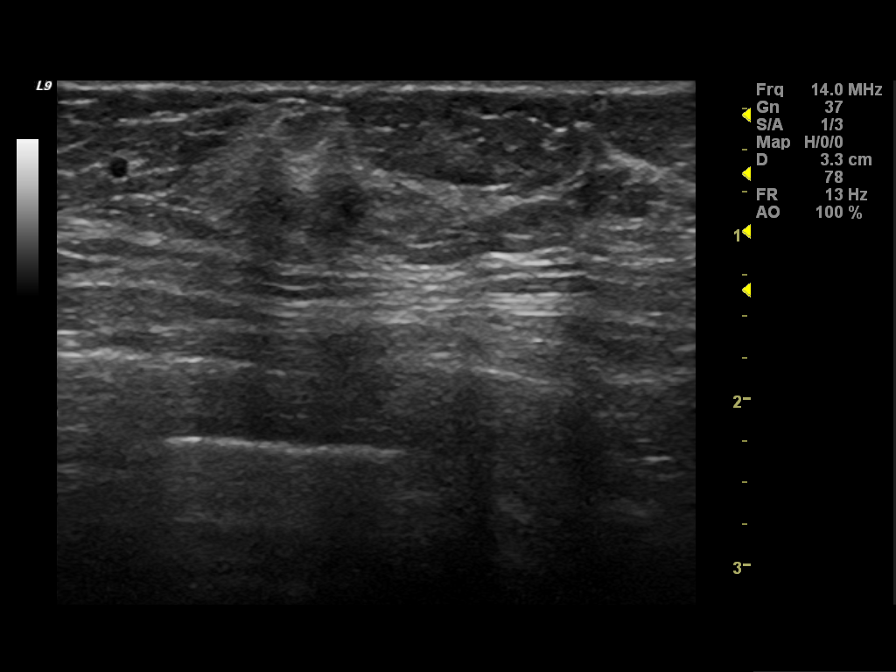
[im 2/9]
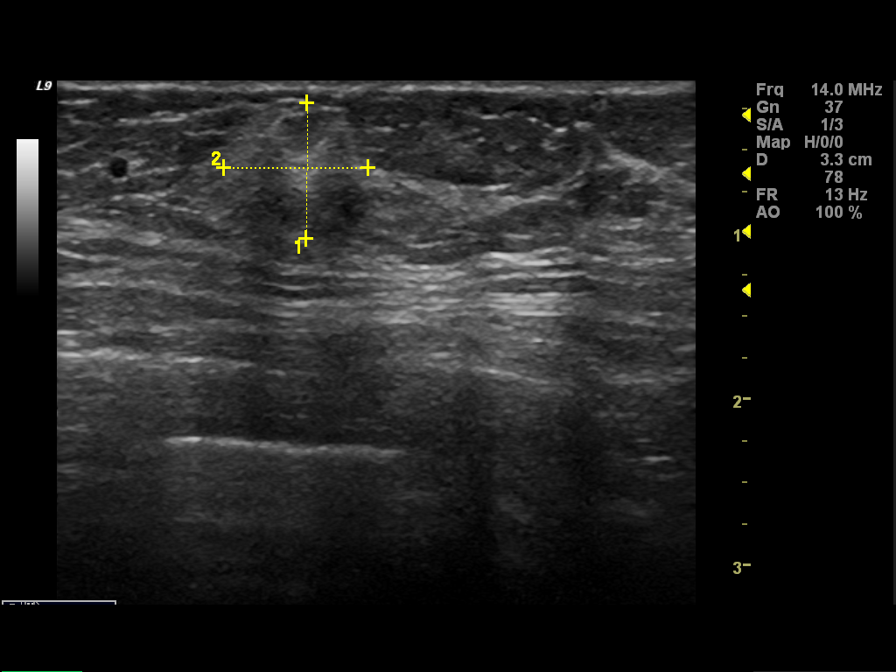
[im 3/9]
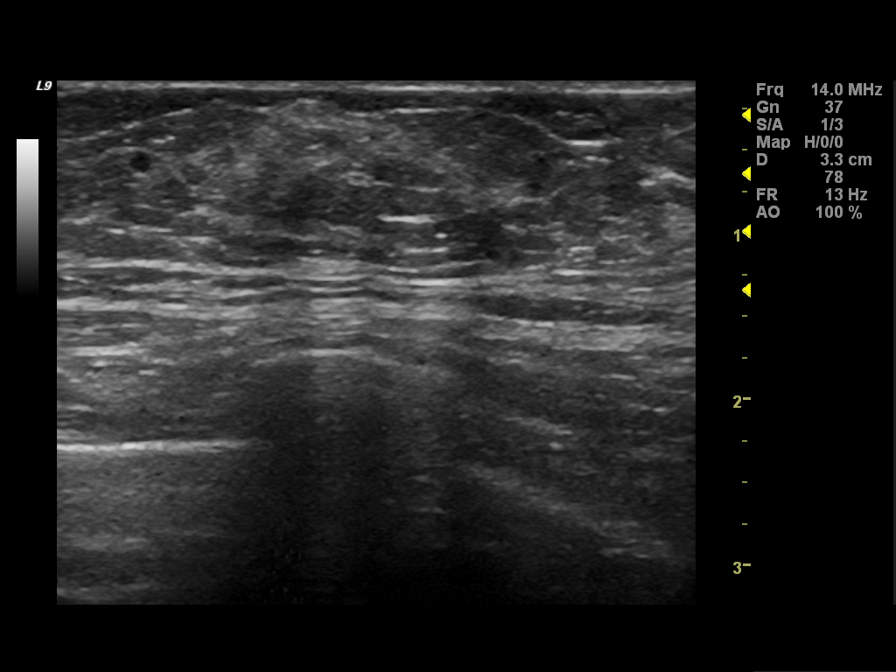
[im 4/9]
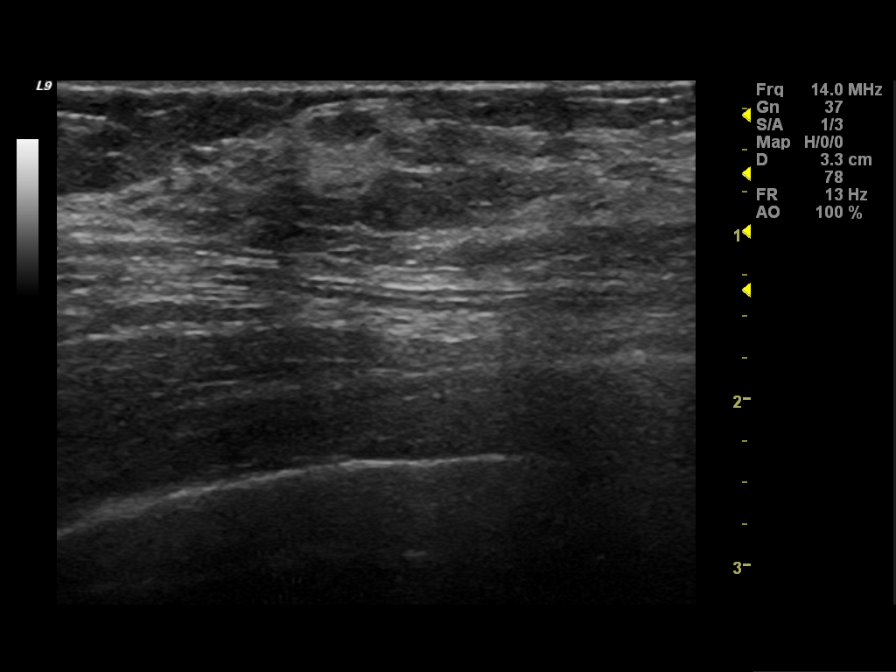
[im 5/9]
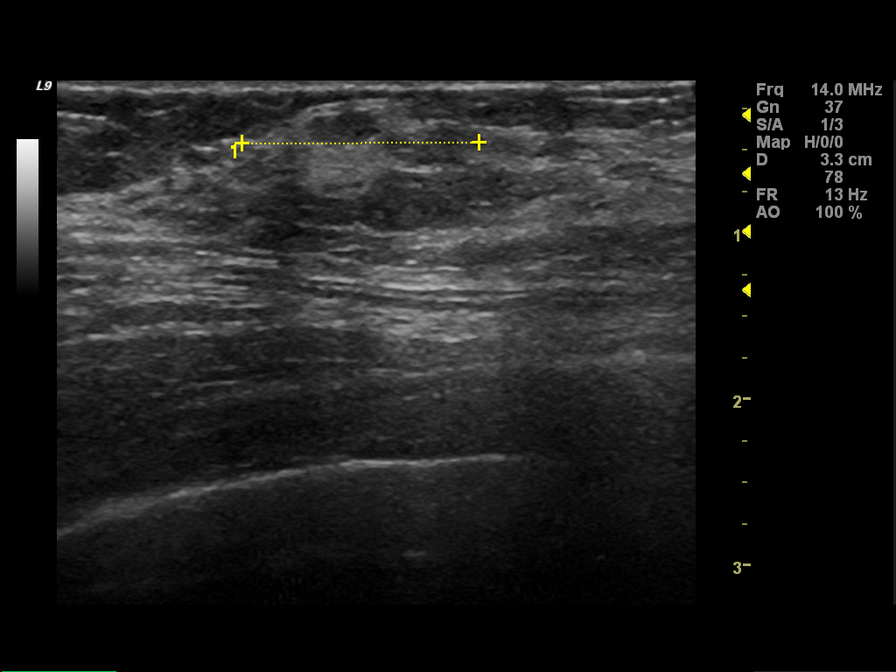
[im 6/9]
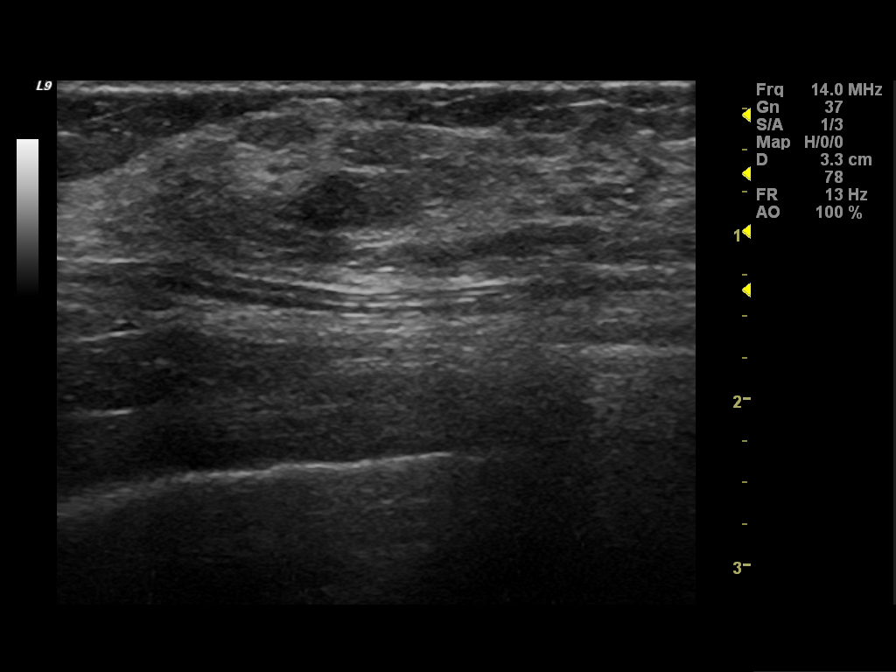
[im 7/9]
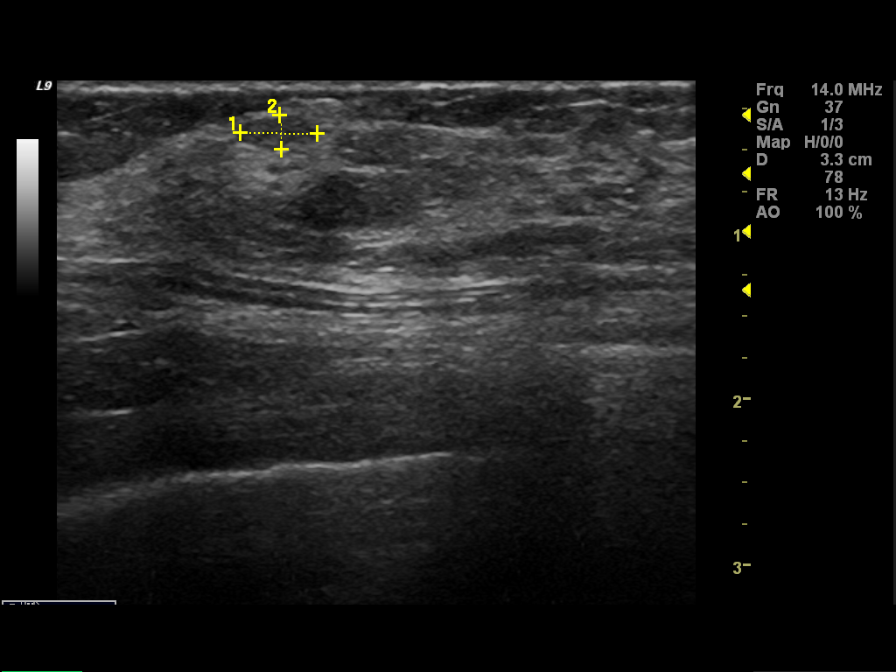
[im 8/9]
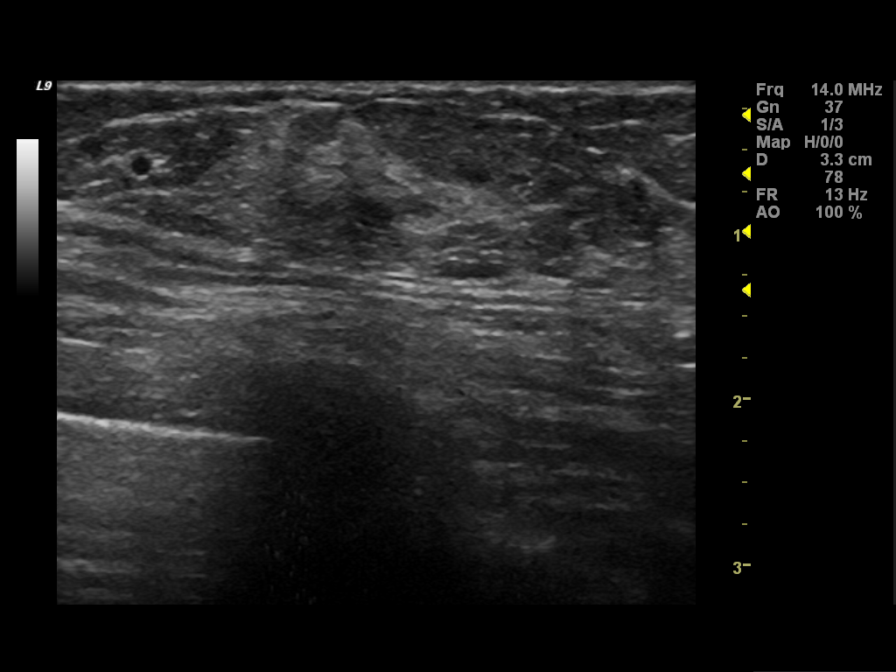
[im 9/9]
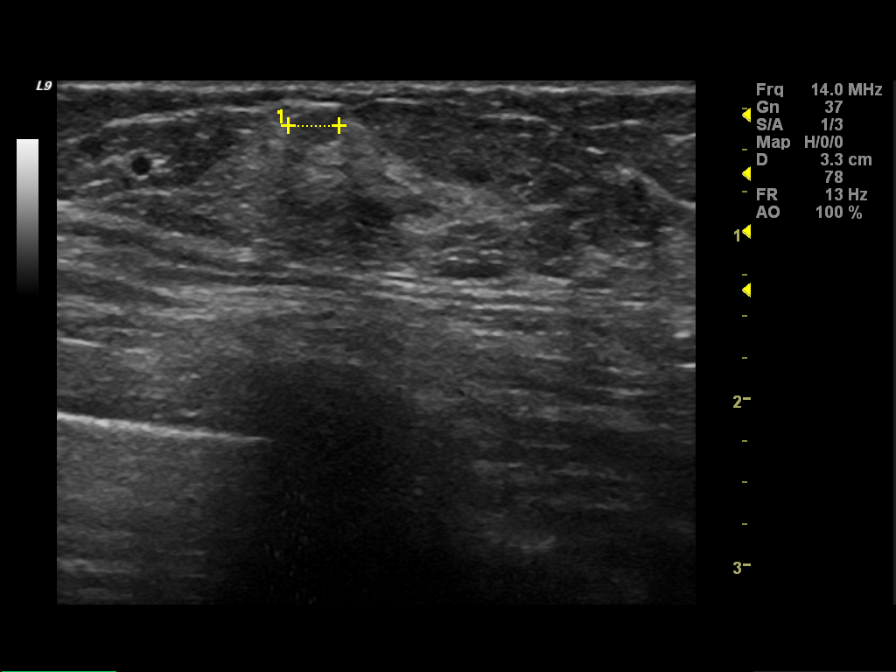

[9 of 9 positions shown; findings below may reference images not displayed]

FINDINGS: Exam demonstrates scattered fibroglandular densities.
There is no focal mass, suspicious clusters of microcalcifications
or parenchymal distortion.  There is no focal abnormality to
correspond to patient's palpable abnormality in the upper outer
left breast.
Mammographic images were processed with CAD.

Ultrasound is performed, showing a focally prominent area of
predominately echogenic tissue containing an ovoid well-defined
hypoechoic nodule along the superficial aspect at the 1 o'clock
position 12 cm from the nipple corresponding to the area of
patient's palpable abnormality.  The small nodule measures 2 x 3 x
5 mm and may represent a small fibroadenoma or minimal cystic
change due to fat necrosis.
IMPRESSION: Focal area of predominately echogenic tissue at the 1 o'clock
position of the left breast 12 cm from the nipple content
containing a small hypoechoic nodule measuring 2 x 3 x 5 mm.  This
may represent a small fibroadenoma over the area of prominent
fibroglandular tissue versus a focus of fat necrosis.

Recommendations:  Recommend a 6-month follow-up diagnostic left
breast mammogram and ultrasound.  The patient was instructed to
continue self breast examination and to return sooner if this
palpable abnormality worsens.

BI-RADS CATEGORY 3:  Probably benign finding(s) - short interval
follow-up suggested.

## 2012-09-05 ENCOUNTER — Other Ambulatory Visit (INDEPENDENT_AMBULATORY_CARE_PROVIDER_SITE_OTHER): Payer: BC Managed Care – PPO

## 2012-09-05 DIAGNOSIS — Z0000 Encounter for general adult medical examination without abnormal findings: Secondary | ICD-10-CM

## 2012-09-05 LAB — CBC WITH DIFFERENTIAL/PLATELET
Basophils Absolute: 0 10*3/uL (ref 0.0–0.1)
Eosinophils Absolute: 0.1 10*3/uL (ref 0.0–0.7)
HCT: 40.1 % (ref 36.0–46.0)
Hemoglobin: 13.7 g/dL (ref 12.0–15.0)
Lymphs Abs: 2 10*3/uL (ref 0.7–4.0)
MCHC: 34.1 g/dL (ref 30.0–36.0)
MCV: 90.8 fl (ref 78.0–100.0)
Monocytes Absolute: 0.5 10*3/uL (ref 0.1–1.0)
Neutro Abs: 5.5 10*3/uL (ref 1.4–7.7)
RDW: 12.2 % (ref 11.5–14.6)

## 2012-09-05 LAB — BASIC METABOLIC PANEL
CO2: 27 mEq/L (ref 19–32)
Chloride: 103 mEq/L (ref 96–112)
Sodium: 137 mEq/L (ref 135–145)

## 2012-09-05 LAB — HEPATIC FUNCTION PANEL
ALT: 20 U/L (ref 0–35)
AST: 22 U/L (ref 0–37)
Albumin: 3.6 g/dL (ref 3.5–5.2)
Alkaline Phosphatase: 60 U/L (ref 39–117)
Bilirubin, Direct: 0.1 mg/dL (ref 0.0–0.3)
Total Bilirubin: 0.8 mg/dL (ref 0.3–1.2)
Total Protein: 6.9 g/dL (ref 6.0–8.3)

## 2012-09-05 LAB — URINALYSIS, ROUTINE W REFLEX MICROSCOPIC
Nitrite: NEGATIVE
Specific Gravity, Urine: 1.025 (ref 1.000–1.030)
Urine Glucose: NEGATIVE
Urobilinogen, UA: 0.2 (ref 0.0–1.0)

## 2012-09-05 LAB — LIPID PANEL
Cholesterol: 259 mg/dL — ABNORMAL HIGH (ref 0–200)
HDL: 64.1 mg/dL (ref >39.00)
Total CHOL/HDL Ratio: 4
Triglycerides: 164 mg/dL — ABNORMAL HIGH (ref 0.0–149.0)

## 2012-09-05 LAB — LDL CHOLESTEROL, DIRECT: Direct LDL: 169.9 mg/dL

## 2012-09-05 LAB — TSH: TSH: 1.97 u[IU]/mL (ref 0.35–5.50)

## 2012-09-06 ENCOUNTER — Telehealth: Payer: Self-pay | Admitting: Internal Medicine

## 2012-09-06 MED ORDER — CEPHALEXIN 500 MG PO CAPS: 500.0000 mg | ORAL_CAPSULE | Freq: Four times a day (QID) | ORAL | Status: DC

## 2012-09-06 NOTE — Telephone Encounter (Signed)
Done erx 

## 2012-09-06 NOTE — Telephone Encounter (Signed)
Message copied by Corwin Levins on Wed Sep 06, 2012 12:52 PM ------      Message from: Scharlene Gloss B      Created: Wed Sep 06, 2012  7:57 AM       Called the patient and yes she is having UTI symptoms, has been treating OTC and does feel bad today.  Please send in antibiotic.

## 2012-09-06 NOTE — Telephone Encounter (Signed)
Patient informed. 

## 2012-09-13 ENCOUNTER — Ambulatory Visit (INDEPENDENT_AMBULATORY_CARE_PROVIDER_SITE_OTHER): Payer: BC Managed Care – PPO | Admitting: Internal Medicine

## 2012-09-13 ENCOUNTER — Encounter: Payer: Self-pay | Admitting: Internal Medicine

## 2012-09-13 VITALS — BP 120/82 | HR 66 | Temp 98.4°F | Ht 66.5 in | Wt 187.5 lb

## 2012-09-13 DIAGNOSIS — Z0000 Encounter for general adult medical examination without abnormal findings: Secondary | ICD-10-CM | POA: Insufficient documentation

## 2012-09-13 DIAGNOSIS — Z0001 Encounter for general adult medical examination with abnormal findings: Secondary | ICD-10-CM | POA: Insufficient documentation

## 2012-09-13 DIAGNOSIS — Z23 Encounter for immunization: Secondary | ICD-10-CM

## 2012-09-13 MED ORDER — FLUTICASONE-SALMETEROL 250-50 MCG/DOSE IN AEPB: 1.0000 | INHALATION_SPRAY | Freq: Two times a day (BID) | RESPIRATORY_TRACT | Status: DC

## 2012-09-13 NOTE — Assessment & Plan Note (Signed)

## 2012-09-13 NOTE — Patient Instructions (Addendum)
You had the flu shot today Please continue your efforts at being more active, low cholesterol diet, and weight control. Please keep your appointments with your specialists as you have planned - Dr Gaylyn Rong in Dec for lab work, and ultrasound May 2014 Please remember to followup with your GYN for the yearly pap smear and/or mammogram You can also take Mucinex (or it's generic off brand) for congestion Your advair was refilled today as requested Please have the pharmacy call with any refills you may need. Please return in 1 year for your yearly visit, or sooner if needed, with Lab testing done 3-5 days before

## 2012-09-14 ENCOUNTER — Encounter: Payer: Self-pay | Admitting: Internal Medicine

## 2012-09-14 NOTE — Progress Notes (Signed)
Subjective:    Patient ID: Andrea Bonilla, female    DOB: 09/23/1971, 41 y.o.   MRN: [id number]  HPI  Here for wellness and f/u;  Overall doing ok;  Pt denies CP, worsening SOB, DOE, wheezing, orthopnea, PND, worsening LE edema, palpitations, dizziness or syncope.  Pt denies neurological change such as new Headache, facial or extremity weakness.  Pt denies polydipsia, polyuria, or low sugar symptoms. Pt states overall good compliance with treatment and medications, good tolerability, and trying to follow lower cholesterol diet.  Pt denies worsening depressive symptoms, suicidal ideation or panic. No fever, wt loss, night sweats, loss of appetite, or other constitutional symptoms.  Pt states good ability with ADL's, low fall risk, home safety reviewed and adequate, no significant changes in hearing or vision, and occasionally active with exercise.  Unfortunately has been working longer hours with some several lbs wt gain. Has some minor nasal congestion but no fever or pain or itch, sneeze. Past Medical History  Diagnosis Date  . ALLERGIC RHINITIS   . ASTHMA   . HYPERLIPIDEMIA   . OSTEOPENIA   . STRESS FRACTURE, FOOT   . Splenic lesion    Past Surgical History  Procedure Date  . Skin graft right forehead   . Breast surgery     reports that she has quit smoking. She does not have any smokeless tobacco history on file. She reports that she drinks alcohol. She reports that she does not use illicit drugs. family history includes Coronary artery disease in her other; Diabetes in her other; Heart disease in her mother; Hyperlipidemia in her mother; Hypertension in her mother and other; and Stroke in her other. Allergies  Allergen Reactions  . Clarithromycin     REACTION: nausea  . Codeine   . Erythromycin   . Moxifloxacin     REACTION: nausea   Current Outpatient Prescriptions on File Prior to Visit  Medication Sig Dispense Refill  . Calcium Carbonate-Vitamin D (CALCIUM-VITAMIN D) 500-200  MG-UNIT per tablet Take 1 tablet by mouth 2 (two) times daily with a meal.        . cephALEXin (KEFLEX) 500 MG capsule Take 1 capsule (500 mg total) by mouth 4 (four) times daily.  40 capsule  0  . chlorpheniramine-HYDROcodone (TUSSIONEX PENNKINETIC ER) 10-8 MG/5ML LQCR Take 5 mLs by mouth every 12 (twelve) hours as needed.  140 mL  1  . Cholecalciferol (VITAMIN D3) 1000 UNITS CAPS Take 1 capsule by mouth daily.        . Cyanocobalamin (VITAMIN B 12 PO) Take 500 mg by mouth daily.        . fexofenadine (ALLEGRA) 180 MG tablet Take 180 mg by mouth daily.        . Fluticasone-Salmeterol (ADVAIR DISKUS) 250-50 MCG/DOSE AEPB Inhale 1 puff into the lungs 2 (two) times daily.  180 each  11  . Multiple Vitamin (MULTIVITAMIN) tablet Take 1 tablet by mouth daily.        . norgestimate-ethinyl estradiol (ORTHO-CYCLEN) 0.25-35 MG-MCG per tablet Take 1 tablet by mouth daily.         Review of Systems Review of Systems  Constitutional: Negative for diaphoresis, activity change, appetite change and unexpected weight change.  HENT: Negative for hearing loss, ear pain, facial swelling, mouth sores and neck stiffness.   Eyes: Negative for pain, redness and visual disturbance.  Respiratory: Negative for shortness of breath and wheezing.   Cardiovascular: Negative for chest pain and palpitations.  Gastrointestinal: Negative for diarrhea,  blood in stool, abdominal distention and rectal pain.  Genitourinary: Negative for hematuria, flank pain and decreased urine volume.  Musculoskeletal: Negative for myalgias and joint swelling.  Skin: Negative for color change and wound.  Neurological: Negative for syncope and numbness.  Hematological: Negative for adenopathy.  Psychiatric/Behavioral: Negative for hallucinations, self-injury, decreased concentration and agitation.      Objective:   Physical Exam BP 120/82  Pulse 66  Temp 98.4 F (36.9 C) (Oral)  Ht 5' 6.5" (1.689 m)  Wt 187 lb 8 oz (85.049 kg)  BMI  29.81 kg/m2  SpO2 99% Physical Exam  VS noted Constitutional: Pt is oriented to person, place, and time. Appears well-developed and well-nourished.  HENT:  Head: Normocephalic and atraumatic.  Right Ear: External ear normal.  Left Ear: External ear normal.  Nose: Nose normal.  Mouth/Throat: Oropharynx is clear and moist.  Eyes: Conjunctivae and EOM are normal. Pupils are equal, round, and reactive to light.  Neck: Normal range of motion. Neck supple. No JVD present. No tracheal deviation present.  Cardiovascular: Normal rate, regular rhythm, normal heart sounds and intact distal pulses.   Pulmonary/Chest: Effort normal and breath sounds normal.  Abdominal: Soft. Bowel sounds are normal. There is no tenderness.  Musculoskeletal: Normal range of motion. Exhibits no edema.  Lymphadenopathy:  Has no cervical adenopathy.  Neurological: Pt is alert and oriented to person, place, and time. Pt has normal reflexes. No cranial nerve deficit.  Skin: Skin is warm and dry. No rash noted.  Psychiatric:  Has  normal mood and affect. Behavior is normal.     Assessment & Plan:

## 2012-10-05 ENCOUNTER — Telehealth: Payer: Self-pay | Admitting: Oncology

## 2012-10-05 NOTE — Telephone Encounter (Signed)
pt need to change lab to the nxt week....done

## 2012-10-09 ENCOUNTER — Other Ambulatory Visit: Payer: BC Managed Care – PPO | Admitting: Lab

## 2012-10-16 ENCOUNTER — Other Ambulatory Visit (HOSPITAL_BASED_OUTPATIENT_CLINIC_OR_DEPARTMENT_OTHER): Payer: BC Managed Care – PPO | Admitting: Lab

## 2012-10-16 DIAGNOSIS — D739 Disease of spleen, unspecified: Secondary | ICD-10-CM

## 2012-10-16 LAB — COMPREHENSIVE METABOLIC PANEL (CC13)
Albumin: 3.2 g/dL — ABNORMAL LOW (ref 3.5–5.0)
Alkaline Phosphatase: 61 U/L (ref 40–150)
BUN: 8 mg/dL (ref 7.0–26.0)
Creatinine: 0.9 mg/dL (ref 0.6–1.1)
Glucose: 94 mg/dl (ref 70–99)
Total Bilirubin: 0.44 mg/dL (ref 0.20–1.20)

## 2012-10-16 LAB — CBC WITH DIFFERENTIAL/PLATELET
Basophils Absolute: 0 10*3/uL (ref 0.0–0.1)
EOS%: 1.8 % (ref 0.0–7.0)
HCT: 36.8 % (ref 34.8–46.6)
HGB: 12.4 g/dL (ref 11.6–15.9)
LYMPH%: 25.9 % (ref 14.0–49.7)
MCH: 30.1 pg (ref 25.1–34.0)
NEUT%: 63 % (ref 38.4–76.8)
Platelets: 185 10*3/uL (ref 145–400)
lymph#: 1.6 10*3/uL (ref 0.9–3.3)

## 2012-10-17 ENCOUNTER — Telehealth: Payer: Self-pay | Admitting: *Deleted

## 2012-10-17 ENCOUNTER — Encounter: Payer: Self-pay | Admitting: *Deleted

## 2012-10-17 NOTE — Telephone Encounter (Signed)
Patient called requesting yesterday's lab results.  Noted all test are not final.  Offered My chart as a source to retrieve results when released by provider.  Generated Mychart letter to be mailed to patient today.  Will notify staff of request for results.

## 2012-11-13 ENCOUNTER — Ambulatory Visit (INDEPENDENT_AMBULATORY_CARE_PROVIDER_SITE_OTHER): Payer: BC Managed Care – PPO | Admitting: Internal Medicine

## 2012-11-13 ENCOUNTER — Other Ambulatory Visit: Payer: BC Managed Care – PPO

## 2012-11-13 ENCOUNTER — Encounter: Payer: Self-pay | Admitting: Internal Medicine

## 2012-11-13 VITALS — BP 118/80 | HR 72 | Temp 98.6°F | Ht 66.5 in | Wt 187.2 lb

## 2012-11-13 DIAGNOSIS — R300 Dysuria: Secondary | ICD-10-CM | POA: Insufficient documentation

## 2012-11-13 DIAGNOSIS — M545 Low back pain: Secondary | ICD-10-CM

## 2012-11-13 DIAGNOSIS — J45909 Unspecified asthma, uncomplicated: Secondary | ICD-10-CM

## 2012-11-13 DIAGNOSIS — J069 Acute upper respiratory infection, unspecified: Secondary | ICD-10-CM | POA: Insufficient documentation

## 2012-11-13 LAB — POCT URINALYSIS DIPSTICK
Glucose, UA: NEGATIVE
Ketones, UA: NEGATIVE
Protein, UA: NEGATIVE

## 2012-11-13 MED ORDER — NITROFURANTOIN MACROCRYSTAL 100 MG PO CAPS: 100.0000 mg | ORAL_CAPSULE | Freq: Four times a day (QID) | ORAL | Status: DC

## 2012-11-13 NOTE — Assessment & Plan Note (Signed)
?   Viral, for mucinex otc prn,  to f/u any worsening symptoms or concerns

## 2012-11-13 NOTE — Assessment & Plan Note (Signed)
stable overall by hx and exam, most recent data reviewed with pt, and pt to continue medical treatment as before SpO2 Readings from Last 3 Encounters:  11/13/12 97%  09/13/12 99%  02/08/12 99%

## 2012-11-13 NOTE — Assessment & Plan Note (Signed)
prob uti, Mild to mod, for antibx course,  to f/u any worsening symptoms or concerns, for urine cx, UA dip reviewed with pt

## 2012-11-13 NOTE — Patient Instructions (Addendum)
Take all new medications as prescribed Continue all other medications as before Your specimen will be sent for culture today You will be contacted by phone if any changes need to be made immediately.  Otherwise, you will receive a letter about your results with an explanation, but please check with MyChart first. Thank you for enrolling in MyChart. Please follow the instructions below to securely access your online medical record. MyChart allows you to send messages to your doctor, view your test results, renew your prescriptions, schedule appointments, and more. To Log into MyChart, please go to [email or web address], and your Username is: [email or web address]

## 2012-11-13 NOTE — Progress Notes (Signed)
Subjective:    Patient ID: Andrea Bonilla, female    DOB: [birth year], 41 y.o.   MRN: [id number]  HPI   Here with 3 days acute onset fever, facial pain, pressure, general weakness and malaise, and colored d/c, with slight ST, but little to no cough and Pt denies chest pain, increased sob or doe, wheezing, orthopnea, PND, increased LE swelling, palpitations, dizziness or syncope.  Also with urinary symptoms of 2 days dysuria, frequency, urgency,but no hematuria, flank pain, chills. Pt denies new neurological symptoms such as new headache, or facial or extremity weakness or numbness Overall good compliance with treatment, and good medicine tolerability. Past Medical History  Diagnosis Date  . ALLERGIC RHINITIS   . ASTHMA   . HYPERLIPIDEMIA   . OSTEOPENIA   . STRESS FRACTURE, FOOT   . Splenic lesion    Past Surgical History  Procedure Date  . Skin graft right forehead   . Breast surgery     reports that she has quit smoking. She does not have any smokeless tobacco history on file. She reports that she drinks alcohol. She reports that she does not use illicit drugs. family history includes Coronary artery disease in her other; Diabetes in her other; Heart disease in her mother; Hyperlipidemia in her mother; Hypertension in her mother and other; and Stroke in her other. Allergies  Allergen Reactions  . Clarithromycin     REACTION: nausea  . Codeine   . Erythromycin   . Moxifloxacin     REACTION: nausea   Current Outpatient Prescriptions on File Prior to Visit  Medication Sig Dispense Refill  . Calcium Carbonate-Vitamin D (CALCIUM-VITAMIN D) 500-200 MG-UNIT per tablet Take 1 tablet by mouth 2 (two) times daily with a meal.        . chlorpheniramine-HYDROcodone (TUSSIONEX PENNKINETIC ER) 10-8 MG/5ML LQCR Take 5 mLs by mouth every 12 (twelve) hours as needed.  140 mL  1  . Cholecalciferol (VITAMIN D3) 1000 UNITS CAPS Take 1 capsule by mouth daily.        . Cyanocobalamin (VITAMIN B 12 PO)  Take 500 mg by mouth daily.        . fexofenadine (ALLEGRA) 180 MG tablet Take 180 mg by mouth daily.        . Fluticasone-Salmeterol (ADVAIR DISKUS) 250-50 MCG/DOSE AEPB Inhale 1 puff into the lungs 2 (two) times daily.  180 each  11  . Multiple Vitamin (MULTIVITAMIN) tablet Take 1 tablet by mouth daily.        . norgestimate-ethinyl estradiol (ORTHO-CYCLEN) 0.25-35 MG-MCG per tablet Take 1 tablet by mouth daily.        . cephALEXin (KEFLEX) 500 MG capsule Take 1 capsule (500 mg total) by mouth 4 (four) times daily.  40 capsule  0   Review of Systems  Constitutional: Negative for diaphoresis and unexpected weight change.  HENT: Negative for tinnitus.   Eyes: Negative for photophobia and visual disturbance.  Respiratory: Negative for choking and stridor.   Gastrointestinal: Negative for vomiting and blood in stool.  Genitourinary: Negative for hematuria and decreased urine volume.  Musculoskeletal: Negative for gait problem.  Skin: Negative for color change and wound.  Neurological: Negative for tremors and numbness.  Psychiatric/Behavioral: Negative for decreased concentration. The patient is not hyperactive.       Objective:   Physical Exam BP 118/80  Pulse 72  Temp 98.6 F (37 C) (Oral)  Ht 5' 6.5" (1.689 m)  Wt 187 lb 4 oz (84.936 kg)  BMI  29.77 kg/m2  SpO2 97% Physical Exam  VS noted, mild ill Constitutional: Pt appears well-developed and well-nourished.  HENT: Head: Normocephalic.  Right Ear: External ear normal.  Left Ear: External ear normal.  Bilat tm's mild erythema.  Sinus nontender.  Pharynx mild erythema Eyes: Conjunctivae and EOM are normal. Pupils are equal, round, and reactive to light.  Neck: Normal range of motion. Neck supple.  Cardiovascular: Normal rate and regular rhythm.   Pulmonary/Chest: Effort normal and breath sounds normal.  Abd:  Soft, NT, non-distended, + BS Neurological: Pt is alert. Not confused  Skin: Skin is warm. No erythema.  Psychiatric:  Pt behavior is normal. Thought content normal.     Assessment & Plan:

## 2012-11-14 LAB — URINE CULTURE: Colony Count: NO GROWTH

## 2012-12-29 ENCOUNTER — Ambulatory Visit (INDEPENDENT_AMBULATORY_CARE_PROVIDER_SITE_OTHER): Payer: BC Managed Care – PPO | Admitting: Internal Medicine

## 2012-12-29 ENCOUNTER — Encounter: Payer: Self-pay | Admitting: Internal Medicine

## 2012-12-29 VITALS — BP 110/80 | HR 71 | Temp 98.1°F | Ht 66.5 in | Wt 184.4 lb

## 2012-12-29 DIAGNOSIS — J45909 Unspecified asthma, uncomplicated: Secondary | ICD-10-CM

## 2012-12-29 DIAGNOSIS — J029 Acute pharyngitis, unspecified: Secondary | ICD-10-CM | POA: Insufficient documentation

## 2012-12-29 DIAGNOSIS — K589 Irritable bowel syndrome without diarrhea: Secondary | ICD-10-CM

## 2012-12-29 MED ORDER — AMOXICILLIN-POT CLAVULANATE 875-125 MG PO TABS: 1.0000 | ORAL_TABLET | Freq: Two times a day (BID) | ORAL | Status: DC

## 2012-12-29 NOTE — Assessment & Plan Note (Signed)
stable overall by history and exam, recent data reviewed with pt, and pt to continue medical treatment as before,  to f/u any worsening symptoms or concerns Lab Results  Component Value Date   WBC 6.0 10/16/2012   HGB 12.4 10/16/2012   HCT 36.8 10/16/2012   PLT 185 10/16/2012   GLUCOSE 94 10/16/2012   CHOL 259* 09/05/2012   TRIG 164.0* 09/05/2012   HDL 64.10 09/05/2012   LDLDIRECT 169.9 09/05/2012   ALT 12 10/16/2012   AST 12 10/16/2012   NA 136 10/16/2012   K 4.1 10/16/2012   CL 106 10/16/2012   CREATININE 0.9 10/16/2012   BUN 8.0 10/16/2012   CO2 25 10/16/2012   TSH 1.97 09/05/2012   INR 0.89 06/17/2011

## 2012-12-29 NOTE — Patient Instructions (Addendum)
Please take all new medication as prescribed Please continue all other medications as before 

## 2012-12-29 NOTE — Progress Notes (Signed)
Subjective:    Patient ID: Andrea Bonilla, female    DOB: 04-Jan-1971, 42 y.o.   MRN: [id number]  HPI  Here with 2-3 days acute onset fever, severe ST especially on the right side, milder facial pain, pressure, headache, general weakness and malaise, but pt denies chest pain, wheezing, increased sob or doe, orthopnea, PND, increased LE swelling, palpitations, dizziness or syncope. Just returned from [place], no other co-workers ill she is aware.  Denies worsening reflux, abd pain, dysphagia, n/v, bowel change or blood. Pt denies new neurological symptoms such as new headache, or facial or extremity weakness or numbness   Pt denies polydipsia, polyuria Past Medical History  Diagnosis Date  . ALLERGIC RHINITIS   . ASTHMA   . HYPERLIPIDEMIA   . OSTEOPENIA   . STRESS FRACTURE, FOOT   . Splenic lesion    Past Surgical History  Procedure Date  . Skin graft right forehead   . Breast surgery     reports that she has quit smoking. She does not have any smokeless tobacco history on file. She reports that she drinks alcohol. She reports that she does not use illicit drugs. family history includes Coronary artery disease in her other; Diabetes in her other; Heart disease in her mother; Hyperlipidemia in her mother; Hypertension in her mother and other; and Stroke in her other. Allergies  Allergen Reactions  . Clarithromycin     REACTION: nausea  . Codeine   . Erythromycin   . Moxifloxacin     REACTION: nausea   Current Outpatient Prescriptions on File Prior to Visit  Medication Sig Dispense Refill  . Calcium Carbonate-Vitamin D (CALCIUM-VITAMIN D) 500-200 MG-UNIT per tablet Take 1 tablet by mouth 2 (two) times daily with a meal.        . chlorpheniramine-HYDROcodone (TUSSIONEX PENNKINETIC ER) 10-8 MG/5ML LQCR Take 5 mLs by mouth every 12 (twelve) hours as needed.  140 mL  1  . Cholecalciferol (VITAMIN D3) 1000 UNITS CAPS Take 1 capsule by mouth daily.        . Cyanocobalamin (VITAMIN B 12 PO)  Take 500 mg by mouth daily.        . fexofenadine (ALLEGRA) 180 MG tablet Take 180 mg by mouth daily.        . Fluticasone-Salmeterol (ADVAIR DISKUS) 250-50 MCG/DOSE AEPB Inhale 1 puff into the lungs 2 (two) times daily.  180 each  11  . Multiple Vitamin (MULTIVITAMIN) tablet Take 1 tablet by mouth daily.        . norgestimate-ethinyl estradiol (ORTHO-CYCLEN) 0.25-35 MG-MCG per tablet Take 1 tablet by mouth daily.         Review of Systems  Constitutional: Negative for unexpected weight change, or unusual diaphoresis  HENT: Negative for tinnitus.   Eyes: Negative for photophobia and visual disturbance.  Respiratory: Negative for choking and stridor.   Gastrointestinal: Negative for vomiting and blood in stool.  Genitourinary: Negative for hematuria and decreased urine volume.  Musculoskeletal: Negative for acute joint swelling Skin: Negative for color change and wound.  Neurological: Negative for tremors and numbness other than noted  Psychiatric/Behavioral: Negative for decreased concentration or  hyperactivity.       Objective:   Physical Exam BP 110/80  Pulse 71  Temp 98.1 F (36.7 C) (Oral)  Ht 5' 6.5" (1.689 m)  Wt 184 lb 6 oz (83.632 kg)  BMI 29.31 kg/m2  SpO2 97% VS noted, mild ill Constitutional: Pt appears well-developed and well-nourished.  HENT: Head: NCAT.  Right  Ear: External ear normal.  Left Ear: External ear normal.  Bilat tm's with mild erythema.  Max sinus areas mild tender.  Pharynx with severe erythema,. Swelling , no abscess but + right tonsillar exudate Eyes: Conjunctivae and EOM are normal. Pupils are equal, round, and reactive to light.  Neck: Normal range of motion. Neck supple. with right > left submandibular LA Cardiovascular: Normal rate and regular rhythm.   Pulmonary/Chest: Effort normal and breath sounds normal.  Neurological: Pt is alert. Not confused  Skin: Skin is warm. No erythema.  Psychiatric: Pt behavior is normal. Thought content normal.      Assessment & Plan:

## 2012-12-29 NOTE — Assessment & Plan Note (Signed)
stable overall by history and exam, recent data reviewed with pt, and pt to continue medical treatment as before,  to f/u any worsening symptoms or concerns SpO2 Readings from Last 3 Encounters:  12/29/12 97%  11/13/12 97%  09/13/12 99%

## 2012-12-29 NOTE — Assessment & Plan Note (Signed)
Mod to severe with exudate, cant r/o strep, for antibx course,  to f/u any worsening symptoms or concerns

## 2013-01-09 ENCOUNTER — Telehealth: Payer: Self-pay | Admitting: Internal Medicine

## 2013-01-09 MED ORDER — FLUCONAZOLE 150 MG PO TABS: ORAL_TABLET | ORAL | Status: DC

## 2013-01-09 NOTE — Telephone Encounter (Signed)
Done erx 

## 2013-01-09 NOTE — Telephone Encounter (Signed)
Patient Information:  Caller Name: Lorayne  Phone: [phone]  Patient: Andrea Bonilla, Andrea Bonilla  Gender: Female  DOB: 1971/11/18  Age: 42 Years  PCP: Oliver Barre (Adults only)  Pregnant: No  Office Follow Up:  Does the office need to follow up with this patient?: Yes  Instructions For The Office: Patient declined appointment; states she has received Rx for yeast  before without having to be seen.  Pharmacy information confirmed in Epic.  Patient requests to be notified either way; may leave voice mail if no answer (this is her cell phone and no one else can get the messages).   Symptoms  Reason For Call & Symptoms: Patient reports she has been on Amoxicillin for sore throat/ URI.  Now has burning and itching in genital area.  White vaginal discharge.  Reviewed Health History In EMR: Yes  Reviewed Medications In EMR: Yes  Reviewed Allergies In EMR: Yes  Reviewed Surgeries / Procedures: Yes  Date of Onset of Symptoms: 01/02/2013  Treatments Tried: OTC topical creams with some relief.  Treatments Tried Worked: No OB / GYN:  LMP: 12/19/2012  Guideline(s) Used:  Vaginal Discharge  Disposition Per Guideline:   See Within 3 Days in Office  Reason For Disposition Reached:   Symptoms of a yeast infection" (i.e., itchy, white discharge, not bad smelling) and not improved > 3 days following Care Advice  Advice Given:  Genital Hygiene:   Keep your genital area dry. Wear cotton underwear or underwear with a cotton crotch.  Do not douche.  Do not use feminine hygiene products.  Patient Refused Recommendation:  Patient Requests Prescription  Requests Rx for Diflucan

## 2013-01-09 NOTE — Telephone Encounter (Signed)
Patient informed. 

## 2013-03-06 ENCOUNTER — Other Ambulatory Visit: Payer: Self-pay

## 2013-03-06 DIAGNOSIS — Z1231 Encounter for screening mammogram for malignant neoplasm of breast: Secondary | ICD-10-CM

## 2013-04-04 ENCOUNTER — Ambulatory Visit
Admission: RE | Admit: 2013-04-04 | Discharge: 2013-04-04 | Disposition: A | Discharge status: Home / Self Care | Payer: BC Managed Care – PPO | Source: Ambulatory Visit

## 2013-04-04 DIAGNOSIS — Z1231 Encounter for screening mammogram for malignant neoplasm of breast: Secondary | ICD-10-CM

## 2013-04-05 ENCOUNTER — Other Ambulatory Visit: Payer: Self-pay | Admitting: Obstetrics and Gynecology

## 2013-04-05 DIAGNOSIS — R928 Other abnormal and inconclusive findings on diagnostic imaging of breast: Secondary | ICD-10-CM

## 2013-04-06 ENCOUNTER — Ambulatory Visit (HOSPITAL_COMMUNITY)
Admission: RE | Admit: 2013-04-06 | Discharge: 2013-04-06 | Disposition: A | Discharge status: Home / Self Care | Payer: BC Managed Care – PPO | Source: Ambulatory Visit | Attending: Oncology | Admitting: Oncology

## 2013-04-06 ENCOUNTER — Other Ambulatory Visit: Payer: Self-pay | Admitting: Oncology

## 2013-04-06 ENCOUNTER — Other Ambulatory Visit (HOSPITAL_BASED_OUTPATIENT_CLINIC_OR_DEPARTMENT_OTHER): Payer: BC Managed Care – PPO | Admitting: Lab

## 2013-04-06 DIAGNOSIS — D739 Disease of spleen, unspecified: Secondary | ICD-10-CM | POA: Insufficient documentation

## 2013-04-06 LAB — CBC WITH DIFFERENTIAL/PLATELET
BASO%: 0.4 % (ref 0.0–2.0)
Eosinophils Absolute: 0.1 10*3/uL (ref 0.0–0.5)
LYMPH%: 21.3 % (ref 14.0–49.7)
MCHC: 34.5 g/dL (ref 31.5–36.0)
MCV: 89.1 fL (ref 79.5–101.0)
MONO%: 6.6 % (ref 0.0–14.0)
Platelets: 178 10*3/uL (ref 145–400)
RBC: 4.09 10*6/uL (ref 3.70–5.45)

## 2013-04-06 LAB — COMPREHENSIVE METABOLIC PANEL (CC13)
ALT: 10 U/L (ref 0–55)
BUN: 6.4 mg/dL — ABNORMAL LOW (ref 7.0–26.0)
CO2: 25 mEq/L (ref 22–29)
Creatinine: 0.9 mg/dL (ref 0.6–1.1)
Total Bilirubin: 0.52 mg/dL (ref 0.20–1.20)

## 2013-04-06 LAB — LACTATE DEHYDROGENASE (CC13): LDH: 142 U/L (ref 125–245)

## 2013-04-09 ENCOUNTER — Ambulatory Visit (HOSPITAL_BASED_OUTPATIENT_CLINIC_OR_DEPARTMENT_OTHER): Payer: BC Managed Care – PPO | Admitting: Oncology

## 2013-04-09 VITALS — BP 123/74 | HR 77 | Temp 98.2°F | Resp 20 | Ht 66.5 in | Wt 184.0 lb

## 2013-04-09 DIAGNOSIS — D739 Disease of spleen, unspecified: Secondary | ICD-10-CM

## 2013-04-09 NOTE — Progress Notes (Signed)
[facility] OFFICE PROGRESS NOTE  Oliver Barre, MD  DIAGNOSIS:  Asymptomatic splenic lesions; NOS; negative work up with pan CT, bone marrow biopsy, low ACE level making sarcoidosis less likely.  CURRENT THERAPY:  watchful observation.  INTERVAL HISTORY: Andrea Bonilla 41 y.o. female returns for regular follow up with her mother.  She again has been avoid peanuts.  She denied abdominal pain, early satiety, SOB, chest pain, fever, anorexia, weight loss, bleeding symptoms.  The rest of the 14-point review of system was negative.   MEDICAL HISTORY: Past Medical History  Diagnosis Date  . ALLERGIC RHINITIS   . ASTHMA   . HYPERLIPIDEMIA   . OSTEOPENIA   . STRESS FRACTURE, FOOT   . Splenic lesion     SURGICAL HISTORY:  Past Surgical History  Procedure Laterality Date  . Skin graft right forehead    . Breast surgery      MEDICATIONS: Current Outpatient Prescriptions  Medication Sig Dispense Refill  . Calcium Carbonate-Vitamin D (CALCIUM-VITAMIN D) 500-200 MG-UNIT per tablet Take 1 tablet by mouth 2 (two) times daily with a meal.        . chlorpheniramine-HYDROcodone (TUSSIONEX PENNKINETIC ER) 10-8 MG/5ML LQCR Take 5 mLs by mouth every 12 (twelve) hours as needed.  140 mL  1  . Cholecalciferol (VITAMIN D3) 1000 UNITS CAPS Take 1 capsule by mouth daily.        . Cyanocobalamin (VITAMIN B 12 PO) Take 500 mg by mouth daily.        . fexofenadine (ALLEGRA) 180 MG tablet Take 180 mg by mouth daily.        . Multiple Vitamin (MULTIVITAMIN) tablet Take 1 tablet by mouth daily.        . norgestimate-ethinyl estradiol (ORTHO-CYCLEN) 0.25-35 MG-MCG per tablet Take 1 tablet by mouth daily.         No current facility-administered medications for this visit.    ALLERGIES:  is allergic to clarithromycin; codeine; erythromycin; and moxifloxacin.  REVIEW OF SYSTEMS:  The rest of the 14-point review of system was negative.   Filed Vitals:   04/09/13 0841  BP: 123/74    Pulse: 77  Temp: 98.2 F (36.8 C)  Resp: 20   Wt Readings from Last 3 Encounters:  04/09/13 184 lb (83.462 kg)  12/29/12 184 lb 6 oz (83.632 kg)  11/13/12 187 lb 4 oz (84.936 kg)   ECOG Performance status: 0.  PHYSICAL EXAMINATION:   General:  well-nourished in no acute distress.  Eyes:  no scleral icterus.  ENT:  There were no oropharyngeal lesions.  Neck was without thyromegaly.  Lymphatics:  Negative cervical, supraclavicular or axillary adenopathy.  Respiratory: lungs were clear bilaterally without wheezing or crackles.  Cardiovascular:  Regular rate and rhythm, S1/S2, without murmur, rub or gallop.  There was no pedal edema.  GI:  abdomen was soft, flat, nontender, nondistended, without organomegaly.  Muscoloskeletal:  no spinal tenderness of palpation of vertebral spine.  Skin exam was without echymosis, petichae.  Neuro exam was nonfocal.  Patient was able to get on and off exam table without assistance.  Gait was normal.  Patient was alert and oriented.  Attention was good.   Language was appropriate.  Mood was normal without depression.  Speech was not pressured.  Thought content was not tangential.    LABORATORY/RADIOLOGY DATA:  Lab Results  Component Value Date   WBC 7.0 04/06/2013   HGB 12.6 04/06/2013   HCT 36.4 04/06/2013  PLT 178 04/06/2013   GLUCOSE 97 04/06/2013   CHOL 259* 09/05/2012   TRIG 164.0* 09/05/2012   HDL 64.10 09/05/2012   LDLDIRECT 169.9 09/05/2012   ALT 10 04/06/2013   AST 13 04/06/2013   NA 138 04/06/2013   K 3.8 04/06/2013   CL 105 04/06/2013   CREATININE 0.9 04/06/2013   BUN 6.4* 04/06/2013   CO2 25 04/06/2013   TSH 1.97 09/05/2012   INR 0.89 06/17/2011   RADIOLOGY [place] 04/06/13:   Findings: The spleen is 9.5 cm in craniocaudal length. Multiple  hypoechoic lesions are again noted in the spleen. The largest  measures up to 2.2 cm compared with 2.7 cm previously.  IMPRESSION:  Scattered small hypoechoic lesions in the spleen. These remain  nonspecific. No evidence of  progression or new disease.      ASSESSMENT AND PLAN:   Splenic lesions: most likely benign.  She is asymptomatic.  Lesions were stable over the past few years.  I recommended observation.  I advised her to watch out for anorexia, weight loss, abdominal pain, early satiety.    I recommended discharge to PCP to follow up with her with yearly CBC and CMET.  [facility] if the need arises.  She expressed informed understanding and agreed with watchful observation and follow up with PCP alone.   The length of time of the face-to-face encounter was 10 minutes. More than 50% of time was spent counseling and coordination of care.

## 2013-04-19 ENCOUNTER — Ambulatory Visit
Admission: RE | Admit: 2013-04-19 | Discharge: 2013-04-19 | Disposition: A | Discharge status: Home / Self Care | Payer: BC Managed Care – PPO | Source: Ambulatory Visit | Attending: Obstetrics and Gynecology | Admitting: Obstetrics and Gynecology

## 2013-04-19 DIAGNOSIS — R928 Other abnormal and inconclusive findings on diagnostic imaging of breast: Secondary | ICD-10-CM

## 2013-07-23 ENCOUNTER — Ambulatory Visit (INDEPENDENT_AMBULATORY_CARE_PROVIDER_SITE_OTHER): Payer: BC Managed Care – PPO | Admitting: Internal Medicine

## 2013-07-23 ENCOUNTER — Encounter: Payer: Self-pay | Admitting: Internal Medicine

## 2013-07-23 VITALS — BP 112/78 | HR 84 | Temp 98.1°F | Ht 66.5 in | Wt 184.0 lb

## 2013-07-23 DIAGNOSIS — J45909 Unspecified asthma, uncomplicated: Secondary | ICD-10-CM

## 2013-07-23 DIAGNOSIS — J309 Allergic rhinitis, unspecified: Secondary | ICD-10-CM

## 2013-07-23 DIAGNOSIS — J019 Acute sinusitis, unspecified: Secondary | ICD-10-CM

## 2013-07-23 MED ORDER — CEPHALEXIN 500 MG PO CAPS: 500.0000 mg | ORAL_CAPSULE | Freq: Four times a day (QID) | ORAL | Status: DC

## 2013-07-23 MED ORDER — LEVOFLOXACIN 250 MG PO TABS: 250.0000 mg | ORAL_TABLET | Freq: Every day | ORAL | Status: DC

## 2013-07-23 NOTE — Progress Notes (Signed)
Subjective:    Patient ID: Andrea Bonilla, female    DOB: 1971-11-14, 42 y.o.   MRN: [id number]  HPI   Here with 2-3 days acute onset fever, facial pain, pressure, headache, general weakness and malaise, and greenish d/c, with mild ST and cough, but pt denies chest pain, wheezing, increased sob or doe, orthopnea, PND, increased LE swelling, palpitations, dizziness or syncope.  Does have several wks ongoing nasal allergy symptoms with clearish congestion, itch and sneezing, without fever, pain, ST, cough, swelling or wheezing.  Pt denies polydipsia, polyuria,  Past Medical History  Diagnosis Date  . ALLERGIC RHINITIS   . ASTHMA   . HYPERLIPIDEMIA   . OSTEOPENIA   . STRESS FRACTURE, FOOT   . Splenic lesion    Past Surgical History  Procedure Laterality Date  . Skin graft right forehead    . Breast surgery      reports that she has quit smoking. She does not have any smokeless tobacco history on file. She reports that  drinks alcohol. She reports that she does not use illicit drugs. family history includes Coronary artery disease in her other; Diabetes in her other; Heart disease in her mother; Hyperlipidemia in her mother; Hypertension in her mother and other; Stroke in her other. Allergies  Allergen Reactions  . Clarithromycin     REACTION: nausea  . Codeine   . Erythromycin   . Moxifloxacin     REACTION: nausea   Current Outpatient Prescriptions on File Prior to Visit  Medication Sig Dispense Refill  . Calcium Carbonate-Vitamin D (CALCIUM-VITAMIN D) 500-200 MG-UNIT per tablet Take 1 tablet by mouth 2 (two) times daily with a meal.        . Cholecalciferol (VITAMIN D3) 1000 UNITS CAPS Take 1 capsule by mouth daily.        . Cyanocobalamin (VITAMIN B 12 PO) Take 500 mg by mouth daily.        . fexofenadine (ALLEGRA) 180 MG tablet Take 180 mg by mouth daily.        . Multiple Vitamin (MULTIVITAMIN) tablet Take 1 tablet by mouth daily.        . norgestimate-ethinyl estradiol  (ORTHO-CYCLEN) 0.25-35 MG-MCG per tablet Take 1 tablet by mouth daily.         No current facility-administered medications on file prior to visit.   Review of Systems  Constitutional: Negative for unexpected weight change, or unusual diaphoresis  HENT: Negative for tinnitus.   Eyes: Negative for photophobia and visual disturbance.  Respiratory: Negative for choking and stridor.   Gastrointestinal: Negative for vomiting and blood in stool.  Genitourinary: Negative for hematuria and decreased urine volume.  Musculoskeletal: Negative for acute joint swelling Skin: Negative for color change and wound.  Neurological: Negative for tremors and numbness other than noted  Psychiatric/Behavioral: Negative for decreased concentration or  hyperactivity.       Objective:   Physical Exam BP 112/78  Pulse 84  Temp(Src) 98.1 F (36.7 C) (Oral)  Ht 5' 6.5" (1.689 m)  Wt 184 lb (83.462 kg)  BMI 29.26 kg/m2  SpO2 96% VS noted, mild ill Constitutional: Pt appears well-developed and well-nourished.  HENT: Head: NCAT.  Right Ear: External ear normal.  Left Ear: External ear normal.  Bilat tm's with mild erythema.  Max sinus areas mild tender.  Pharynx with mild erythema, no exudate Eyes: Conjunctivae and EOM are normal. Pupils are equal, round, and reactive to light.  Neck: Normal range of motion. Neck supple.  Cardiovascular: Normal rate and regular rhythm.   Pulmonary/Chest: Effort normal and breath sounds normal.  Neurological: Pt is alert. Not confused  Skin: Skin is warm. No erythema.  Psychiatric: Pt behavior is normal. Thought content normal.     Assessment & Plan:

## 2013-07-23 NOTE — Assessment & Plan Note (Signed)
To re-start allegra prn,  to f/u any worsening symptoms or concerns

## 2013-07-23 NOTE — Patient Instructions (Addendum)
Please take all new medication as prescribed - the Cephalexin Please ignore the levaquin prescription accidentally sent to your pharmacy Please have the pharmacy call with any other refills you may need.  Please remember to sign up for My Chart if you have not done so, as this will be important to you in the future with finding out test results, communicating by private email, and scheduling acute appointments online when needed.  You can also take Delsym OTC for cough, and/or Mucinex (or it's generic off brand) for congestion, and tylenol as needed for pain.

## 2013-07-23 NOTE — Assessment & Plan Note (Signed)
Mild to mod, for antibx course,  to f/u any worsening symptoms or concerns 

## 2013-07-23 NOTE — Assessment & Plan Note (Signed)
stable overall by history and exam, recent data reviewed with pt, and pt to continue medical treatment as before,  to f/u any worsening symptoms or concerns SpO2 Readings from Last 3 Encounters:  07/23/13 96%  12/29/12 97%  11/13/12 97%

## 2013-09-05 ENCOUNTER — Other Ambulatory Visit (INDEPENDENT_AMBULATORY_CARE_PROVIDER_SITE_OTHER): Payer: BC Managed Care – PPO

## 2013-09-05 DIAGNOSIS — Z0000 Encounter for general adult medical examination without abnormal findings: Secondary | ICD-10-CM

## 2013-09-05 LAB — HEPATIC FUNCTION PANEL
AST: 16 U/L (ref 0–37)
Alkaline Phosphatase: 56 U/L (ref 39–117)
Bilirubin, Direct: 0.1 mg/dL (ref 0.0–0.3)
Total Bilirubin: 0.7 mg/dL (ref 0.3–1.2)

## 2013-09-05 LAB — CBC WITH DIFFERENTIAL/PLATELET
Basophils Absolute: 0 10*3/uL (ref 0.0–0.1)
Eosinophils Absolute: 0.1 10*3/uL (ref 0.0–0.7)
Hemoglobin: 12.6 g/dL (ref 12.0–15.0)
Lymphocytes Relative: 30.1 % (ref 12.0–46.0)
MCHC: 34.3 g/dL (ref 30.0–36.0)
Monocytes Relative: 6.4 % (ref 3.0–12.0)
Neutrophils Relative %: 61.1 % (ref 43.0–77.0)
Platelets: 216 10*3/uL (ref 150.0–400.0)
RDW: 12.2 % (ref 11.5–14.6)

## 2013-09-05 LAB — URINALYSIS, ROUTINE W REFLEX MICROSCOPIC
Bilirubin Urine: NEGATIVE
Nitrite: NEGATIVE
Specific Gravity, Urine: 1.015 (ref 1.000–1.030)
Total Protein, Urine: NEGATIVE
pH: 6.5 (ref 5.0–8.0)

## 2013-09-05 LAB — LIPID PANEL
HDL: 64.6 mg/dL (ref >39.00)
Total CHOL/HDL Ratio: 4
Triglycerides: 213 mg/dL — ABNORMAL HIGH (ref 0.0–149.0)

## 2013-09-05 LAB — LDL CHOLESTEROL, DIRECT: Direct LDL: 156.8 mg/dL

## 2013-09-05 LAB — BASIC METABOLIC PANEL
CO2: 30 mEq/L (ref 19–32)
Calcium: 8.7 mg/dL (ref 8.4–10.5)
Chloride: 104 mEq/L (ref 96–112)
Creatinine, Ser: 0.9 mg/dL (ref 0.4–1.2)
Glucose, Bld: 84 mg/dL (ref 70–99)

## 2013-09-05 LAB — TSH: TSH: 2.45 u[IU]/mL (ref 0.35–5.50)

## 2013-09-12 ENCOUNTER — Other Ambulatory Visit: Payer: Self-pay | Admitting: Obstetrics and Gynecology

## 2013-09-14 ENCOUNTER — Ambulatory Visit (INDEPENDENT_AMBULATORY_CARE_PROVIDER_SITE_OTHER): Payer: BC Managed Care – PPO | Admitting: Internal Medicine

## 2013-09-14 ENCOUNTER — Encounter: Payer: Self-pay | Admitting: Internal Medicine

## 2013-09-14 VITALS — BP 110/70 | HR 82 | Temp 98.3°F | Ht 66.5 in | Wt 186.4 lb

## 2013-09-14 DIAGNOSIS — Z0000 Encounter for general adult medical examination without abnormal findings: Secondary | ICD-10-CM

## 2013-09-14 MED ORDER — FLUTICASONE-SALMETEROL 250-50 MCG/DOSE IN AEPB: 1.0000 | INHALATION_SPRAY | Freq: Two times a day (BID) | RESPIRATORY_TRACT | Status: DC

## 2013-09-14 NOTE — Progress Notes (Signed)
Subjective:    Patient ID: Andrea Bonilla, female    DOB: [birth year], 41 y.o.   MRN: [id number]  HPI  Here for wellness and f/u;  Overall doing ok;  Pt denies CP, worsening SOB, DOE, wheezing, orthopnea, PND, worsening LE edema, palpitations, dizziness or syncope.  Pt denies neurological change such as new headache, facial or extremity weakness.  Pt denies polydipsia, polyuria, or low sugar symptoms. Pt states overall good compliance with treatment and medications, good tolerability, and has been trying to follow lower cholesterol diet.  Pt denies worsening depressive symptoms, suicidal ideation or panic. No fever, night sweats, wt loss, loss of appetite, or other constitutional symptoms.  Pt states good ability with ADL's, has low fall risk, home safety reviewed and adequate, no other significant changes in hearing or vision, and only occasionally active with exercise.   Had flu shot. Recent pap with some abnormality - to f/u with repeat every 6 mo Past Medical History  Diagnosis Date  . ALLERGIC RHINITIS   . ASTHMA   . HYPERLIPIDEMIA   . OSTEOPENIA   . STRESS FRACTURE, FOOT   . Splenic lesion    Past Surgical History  Procedure Laterality Date  . Skin graft right forehead    . Breast surgery      reports that she has quit smoking. She does not have any smokeless tobacco history on file. She reports that she drinks alcohol. She reports that she does not use illicit drugs. family history includes Coronary artery disease in her other; Diabetes in her other; Heart disease in her mother; Hyperlipidemia in her mother; Hypertension in her mother and other; Stroke in her other. Allergies  Allergen Reactions  . Clarithromycin     REACTION: nausea  . Codeine   . Erythromycin   . Moxifloxacin     REACTION: nausea   Current Outpatient Prescriptions on File Prior to Visit  Medication Sig Dispense Refill  . Calcium Carbonate-Vitamin D (CALCIUM-VITAMIN D) 500-200 MG-UNIT per tablet Take 1 tablet  by mouth 2 (two) times daily with a meal.        . Cholecalciferol (VITAMIN D3) 1000 UNITS CAPS Take 1 capsule by mouth daily.        . Cyanocobalamin (VITAMIN B 12 PO) Take 500 mg by mouth daily.        . fexofenadine (ALLEGRA) 180 MG tablet Take 180 mg by mouth daily.        . Multiple Vitamin (MULTIVITAMIN) tablet Take 1 tablet by mouth daily.        . norgestimate-ethinyl estradiol (ORTHO-CYCLEN) 0.25-35 MG-MCG per tablet Take 1 tablet by mouth daily.         No current facility-administered medications on file prior to visit.   Review of Systems Constitutional: Negative for diaphoresis, activity change, appetite change or unexpected weight change.  HENT: Negative for hearing loss, ear pain, facial swelling, mouth sores and neck stiffness.   Eyes: Negative for pain, redness and visual disturbance.  Respiratory: Negative for shortness of breath and wheezing.   Cardiovascular: Negative for chest pain and palpitations.  Gastrointestinal: Negative for diarrhea, blood in stool, abdominal distention or other pain Genitourinary: Negative for hematuria, flank pain or change in urine volume.  Musculoskeletal: Negative for myalgias and joint swelling.  Skin: Negative for color change and wound.  Neurological: Negative for syncope and numbness. other than noted Hematological: Negative for adenopathy.  Psychiatric/Behavioral: Negative for hallucinations, self-injury, decreased concentration and agitation.      Objective:  Physical Exam BP 110/70  Pulse 82  Temp(Src) 98.3 F (36.8 C) (Oral)  Ht 5' 6.5" (1.689 m)  Wt 186 lb 6 oz (84.539 kg)  BMI 29.63 kg/m2  SpO2 95% VS noted,  Constitutional: Pt is oriented to person, place, and time. Appears well-developed and well-nourished.  Head: Normocephalic and atraumatic.  Right Ear: External ear normal.  Left Ear: External ear normal.  Nose: Nose normal.  Mouth/Throat: Oropharynx is clear and moist.  Eyes: Conjunctivae and EOM are normal.  Pupils are equal, round, and reactive to light.  Neck: Normal range of motion. Neck supple. No JVD present. No tracheal deviation present.  Cardiovascular: Normal rate, regular rhythm, normal heart sounds and intact distal pulses.   Pulmonary/Chest: Effort normal and breath sounds normal.  Abdominal: Soft. Bowel sounds are normal. There is no tenderness. No HSM  Musculoskeletal: Normal range of motion. Exhibits no edema.  Lymphadenopathy:  Has no cervical adenopathy.  Neurological: Pt is alert and oriented to person, place, and time. Pt has normal reflexes. No cranial nerve deficit.  Skin: Skin is warm and dry. No rash noted.  Psychiatric:  Has  normal mood and affect. Behavior is normal.     Assessment & Plan:

## 2013-09-14 NOTE — Assessment & Plan Note (Signed)

## 2013-09-14 NOTE — Patient Instructions (Signed)
Please continue all other medications as before, and refills have been done if requested - the advair Please continue your efforts at being more active, low cholesterol diet, and weight control You are otherwise up to date with prevention measures today. Please keep your appointments with your specialists as you have planned - GYN   Please remember to sign up for My Chart if you have not done so, as this will be important to you in the future with finding out test results, communicating by private email, and scheduling acute appointments online when needed.  Please return in 1 year for your yearly visit, or sooner if needed, with Lab testing done 3-5 days before

## 2013-10-03 ENCOUNTER — Other Ambulatory Visit: Payer: Self-pay | Admitting: Obstetrics and Gynecology

## 2013-10-11 ENCOUNTER — Other Ambulatory Visit: Payer: Self-pay | Admitting: Obstetrics and Gynecology

## 2013-10-11 ENCOUNTER — Other Ambulatory Visit: Payer: Self-pay

## 2013-10-11 DIAGNOSIS — N63 Unspecified lump in unspecified breast: Secondary | ICD-10-CM

## 2013-10-15 ENCOUNTER — Ambulatory Visit (INDEPENDENT_AMBULATORY_CARE_PROVIDER_SITE_OTHER): Payer: BC Managed Care – PPO | Admitting: Nurse Practitioner

## 2013-10-15 VITALS — BP 130/82 | HR 82 | Temp 100.3°F | Wt 189.0 lb

## 2013-10-15 DIAGNOSIS — J209 Acute bronchitis, unspecified: Secondary | ICD-10-CM

## 2013-10-15 DIAGNOSIS — J069 Acute upper respiratory infection, unspecified: Secondary | ICD-10-CM

## 2013-10-15 NOTE — Patient Instructions (Addendum)
I think you have a viral respiratory illness-possibly bronchitis or flu. Start daily sinus rinses Lloyd Huger med sinus rinse) and pseudoephedrine for the next 5-10 days. You may use ibuprophen if you develop sinus headache. You should be feeling better in next 3-4 days. If you develop fever over 100.4, chest pain with deep inspiration, or are still running low grade fever at the end of the week please call for re-evaluation. Sip fluids every hour. Rest. You are contagious until 24 hours after fever stops. Feel better!    Upper Respiratory Infection, Adult An upper respiratory infection (URI) is also sometimes known as the common cold. The upper respiratory tract includes the nose, sinuses, throat, trachea, and bronchi. Bronchi are the airways leading to the lungs. Most people improve within 1 week, but symptoms can last up to 2 weeks. A residual cough may last even longer.  CAUSES Many different viruses can infect the tissues lining the upper respiratory tract. The tissues become irritated and inflamed and often become very moist. Mucus production is also common. A cold is contagious. You can easily spread the virus to others by oral contact. This includes kissing, sharing a glass, coughing, or sneezing. Touching your mouth or nose and then touching a surface, which is then touched by another person, can also spread the virus. SYMPTOMS  Symptoms typically develop 1 to 3 days after you come in contact with a cold virus. Symptoms vary from person to person. They may include:  Runny nose.  Sneezing.  Nasal congestion.  Sinus irritation.  Sore throat.  Loss of voice (laryngitis).  Cough.  Fatigue.  Muscle aches.  Loss of appetite.  Headache.  Low-grade fever. DIAGNOSIS  You might diagnose your own cold based on familiar symptoms, since most people get a cold 2 to 3 times a year. Your caregiver can confirm this based on your exam. Most importantly, your caregiver can check that your symptoms  are not due to another disease such as strep throat, sinusitis, pneumonia, asthma, or epiglottitis. Blood tests, throat tests, and X-rays are not necessary to diagnose a common cold, but they may sometimes be helpful in excluding other more serious diseases. Your caregiver will decide if any further tests are required. RISKS AND COMPLICATIONS  You may be at risk for a more severe case of the common cold if you smoke cigarettes, have chronic heart disease (such as heart failure) or lung disease (such as asthma), or if you have a weakened immune system. The very young and very old are also at risk for more serious infections. Bacterial sinusitis, middle ear infections, and bacterial pneumonia can complicate the common cold. The common cold can worsen asthma and chronic obstructive pulmonary disease (COPD). Sometimes, these complications can require emergency medical care and may be life-threatening. PREVENTION  The best way to protect against getting a cold is to practice good hygiene. Avoid oral or hand contact with people with cold symptoms. Wash your hands often if contact occurs. There is no clear evidence that vitamin C, vitamin E, echinacea, or exercise reduces the chance of developing a cold. However, it is always recommended to get plenty of rest and practice good nutrition. TREATMENT  Treatment is directed at relieving symptoms. There is no cure. Antibiotics are not effective, because the infection is caused by a virus, not by bacteria. Treatment may include:  Increased fluid intake. Sports drinks offer valuable electrolytes, sugars, and fluids.  Breathing heated mist or steam (vaporizer or shower).  Eating chicken soup or  other clear broths, and maintaining good nutrition.  Getting plenty of rest.  Using gargles or lozenges for comfort.  Controlling fevers with ibuprofen or acetaminophen as directed by your caregiver.  Increasing usage of your inhaler if you have asthma. Zinc gel and  zinc lozenges, taken in the first 24 hours of the common cold, can shorten the duration and lessen the severity of symptoms. Pain medicines may help with fever, muscle aches, and throat pain. A variety of non-prescription medicines are available to treat congestion and runny nose. Your caregiver can make recommendations and may suggest nasal or lung inhalers for other symptoms.  HOME CARE INSTRUCTIONS   Only take over-the-counter or prescription medicines for pain, discomfort, or fever as directed by your caregiver.  Use a warm mist humidifier or inhale steam from a shower to increase air moisture. This may keep secretions moist and make it easier to breathe.  Drink enough water and fluids to keep your urine clear or pale yellow.  Rest as needed.  Return to work when your temperature has returned to normal or as your caregiver advises. You may need to stay home longer to avoid infecting others. You can also use a face mask and careful hand washing to prevent spread of the virus. SEEK MEDICAL CARE IF:   After the first few days, you feel you are getting worse rather than better.  You need your caregiver's advice about medicines to control symptoms.  You develop chills, worsening shortness of breath, or brown or red sputum. These may be signs of pneumonia.  You develop yellow or brown nasal discharge or pain in the face, especially when you bend forward. These may be signs of sinusitis.  You develop a fever, swollen neck glands, pain with swallowing, or white areas in the back of your throat. These may be signs of strep throat. SEEK IMMEDIATE MEDICAL CARE IF:   You have a fever.  You develop severe or persistent headache, ear pain, sinus pain, or chest pain.  You develop wheezing, a prolonged cough, cough up blood, or have a change in your usual mucus (if you have chronic lung disease).  You develop sore muscles or a stiff neck. Document Released: 05/18/2001 Document Revised: 02/14/2012  Document Reviewed: 03/26/2011 [facility] Patient Information 2014 [place], [place].  Acute Bronchitis Bronchitis is inflammation of the airways that extend from the windpipe into the lungs (bronchi). The inflammation often causes mucus to develop. This leads to a cough, which is the most common symptom of bronchitis.  In acute bronchitis, the condition usually develops suddenly and goes away over time, usually in a couple weeks. Smoking, allergies, and asthma can make bronchitis worse. Repeated episodes of bronchitis may cause further lung problems.  CAUSES Acute bronchitis is most often caused by the same virus that causes a cold. The virus can spread from person to person (contagious).  SIGNS AND SYMPTOMS   Cough.   Fever.   Coughing up mucus.   Body aches.   Chest congestion.   Chills.   Shortness of breath.   Sore throat.  DIAGNOSIS  Acute bronchitis is usually diagnosed through a physical exam. Tests, such as chest X-rays, are sometimes done to rule out other conditions.  TREATMENT  Acute bronchitis usually goes away in a couple weeks. Often times, no medical treatment is necessary. Medicines are sometimes given for relief of fever or cough. Antibiotics are usually not needed but may be prescribed in certain situations. In some cases, an inhaler may be recommended  to help reduce shortness of breath and control the cough. A cool mist vaporizer may also be used to help thin bronchial secretions and make it easier to clear the chest.  HOME CARE INSTRUCTIONS  Get plenty of rest.   Drink enough fluids to keep your urine clear or pale yellow (unless you have a medical condition that requires fluid restriction). Increasing fluids may help thin your secretions and will prevent dehydration.   Only take over-the-counter or prescription medicines as directed by your health care provider.   Avoid smoking and secondhand smoke. Exposure to cigarette smoke or irritating chemicals  will make bronchitis worse. If you are a smoker, consider using nicotine gum or skin patches to help control withdrawal symptoms. Quitting smoking will help your lungs heal faster.   Reduce the chances of another bout of acute bronchitis by washing your hands frequently, avoiding people with cold symptoms, and trying not to touch your hands to your mouth, nose, or eyes.   Follow up with your health care provider as directed.  SEEK MEDICAL CARE IF: Your symptoms do not improve after 1 week of treatment.  SEEK IMMEDIATE MEDICAL CARE IF:  You develop an increased fever or chills.   You have chest pain.   You have severe shortness of breath.  You have bloody sputum.   You develop dehydration.  You develop fainting.  You develop repeated vomiting.  You develop a severe headache. MAKE SURE YOU:   Understand these instructions.  Will watch your condition.  Will get help right away if you are not doing well or get worse. Document Released: 12/30/2004 Document Revised: 07/25/2013 Document Reviewed: 05/15/2013 [facility] Patient Information 2014 [place], [place].

## 2013-10-15 NOTE — Progress Notes (Signed)
  Subjective:    Patient ID: Andrea Bonilla, female    DOB: 13-Jul-1971, 42 y.o.   MRN: [id number]  Cough This is a new problem. The current episode started in the past 7 days (4 d). The problem has been unchanged (sore throat better. more nasal drainage). The problem occurs hourly. The cough is non-productive. Associated symptoms include chest pain (tight), ear congestion, a fever, nasal congestion, postnasal drip and a sore throat. Pertinent negatives include no chills, ear pain (ear fullness), eye redness, headaches, rash, shortness of breath or wheezing. Nothing aggravates the symptoms. She has tried nothing for the symptoms. The treatment provided no relief. Her past medical history is significant for asthma (childhood) and bronchitis. There is no history of COPD or pneumonia. treated for sinusitis 3 mos. ago. 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 Pertinent negatives include no abdominal pain, ear pain (ear fullness), headaches, rash or wheezing.      Review of Systems  Constitutional: Positive for fever. Negative for chills, activity change, appetite change and fatigue.  HENT: Positive for congestion, postnasal drip, sore throat and voice change (hoarse). Negative for ear pain (ear fullness) and sinus pressure.   Eyes: Negative for redness.  Respiratory: Positive for cough and chest tightness. Negative for shortness of breath and wheezing.   Cardiovascular: Positive for chest pain (tight).  Gastrointestinal: Negative for abdominal pain.  Skin: Negative for rash.  Neurological: Negative for headaches.  Hematological: Negative for adenopathy.       Objective:   Physical Exam  Vitals reviewed. Constitutional: She is oriented to person, place, and time. She appears well-developed and well-nourished. No distress.  HENT:  Head: Normocephalic and atraumatic.  Right Ear: No tenderness.  Tympanic membrane is not injected and not bulging. A middle ear effusion is present.  Left Ear: No tenderness. Tympanic membrane is not injected and not bulging. A middle ear effusion is present.  Mouth/Throat: Oropharynx is clear and moist. No oropharyngeal exudate.  Eyes: Conjunctivae are normal. Right eye exhibits no discharge. Left eye exhibits no discharge.  Neck: No tracheal deviation present. No thyromegaly present.  Cardiovascular: Normal rate and normal heart sounds.   No murmur heard. Pulmonary/Chest: Effort normal and breath sounds normal. She has no wheezes.  Lymphadenopathy:    She has no cervical adenopathy.  Neurological: She is alert and oriented to person, place, and time.  Skin: Skin is warm and dry.  Psychiatric: She has a normal mood and affect. Her behavior is normal. Thought content normal.          Assessment & Plan:   1. 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 Treated for sinusitis 3 mos ago, prior had URI 11 mos ago.  See pt instructions for symptom management & f/u.

## 2013-10-15 NOTE — Progress Notes (Signed)
Pre-visit discussion using our clinic review tool. No additional management support is needed unless otherwise documented below in the visit note.  

## 2013-10-19 ENCOUNTER — Ambulatory Visit
Admission: RE | Admit: 2013-10-19 | Discharge: 2013-10-19 | Disposition: A | Discharge status: Home / Self Care | Payer: Managed Care, Other (non HMO) | Source: Ambulatory Visit | Attending: Obstetrics and Gynecology | Admitting: Obstetrics and Gynecology

## 2013-10-19 DIAGNOSIS — N63 Unspecified lump in unspecified breast: Secondary | ICD-10-CM

## 2013-11-13 ENCOUNTER — Ambulatory Visit (INDEPENDENT_AMBULATORY_CARE_PROVIDER_SITE_OTHER): Payer: BC Managed Care – PPO | Admitting: Internal Medicine

## 2013-11-13 ENCOUNTER — Encounter: Payer: Self-pay | Admitting: Internal Medicine

## 2013-11-13 VITALS — BP 112/80 | HR 59 | Temp 98.8°F

## 2013-11-13 DIAGNOSIS — J45901 Unspecified asthma with (acute) exacerbation: Secondary | ICD-10-CM

## 2013-11-13 DIAGNOSIS — J45909 Unspecified asthma, uncomplicated: Secondary | ICD-10-CM

## 2013-11-13 MED ORDER — ALBUTEROL SULFATE HFA 108 (90 BASE) MCG/ACT IN AERS: 2.0000 | INHALATION_SPRAY | Freq: Four times a day (QID) | RESPIRATORY_TRACT | Status: DC | PRN

## 2013-11-13 MED ORDER — HYDROCOD POLST-CHLORPHEN POLST 10-8 MG/5ML PO LQCR: 5.0000 mL | Freq: Two times a day (BID) | ORAL | Status: DC | PRN

## 2013-11-13 MED ORDER — AZITHROMYCIN 250 MG PO TABS: ORAL_TABLET | ORAL | Status: DC

## 2013-11-13 MED ORDER — METHYLPREDNISOLONE ACETATE 80 MG/ML IJ SUSP
80.0000 mg | Freq: Once | INTRAMUSCULAR | Status: AC
  Administered 2013-11-13: 80 mg via INTRAMUSCULAR

## 2013-11-13 NOTE — Patient Instructions (Addendum)
It was good to see you today.  Medrol steroid shot given to you today for asthmatic flare causing cough  Zpak antibiotics and prescription cough syrup. Also use albuterol rescue inhaler as needed for cough symptoms - Your prescription(s) have been Given to you and/or submitted to your pharmacy. Please take as directed and contact our office if you believe you are having problem(s) with the medication(s).  Alternate between ibuprofen and tylenol for aches, pain and fever symptoms as discussed  Continue Advair twice daily as ongoing  Hydrate, rest and call if worse or unimproved  Asthma, Acute Bronchospasm Acute bronchospasm caused by asthma is also referred to as an asthma attack. Bronchospasm means your air passages become narrowed. The narrowing is caused by inflammation and tightening of the muscles in the air tubes (bronchi) in your lungs. This can make it hard to breath or cause you to wheeze and cough. CAUSES Possible triggers are:  Animal dander from the skin, hair, or feathers of animals.  Dust mites contained in house dust.  Cockroaches.  Pollen from trees or grass.  Mold.  Cigarette or tobacco smoke.  Air pollutants such as dust, household cleaners, hair sprays, aerosol sprays, paint fumes, strong chemicals, or strong odors.  Cold air or weather changes. Cold air may trigger inflammation. Winds increase molds and pollens in the air.  Strong emotions such as crying or laughing hard.  Stress.  Certain medicines such as aspirin or beta-blockers.  Sulfites in foods and drinks, such as dried fruits and wine.  Infections or inflammatory conditions, such as a flu, cold, or inflammation of the nasal membranes (rhinitis).  Gastroesophageal reflux disease (GERD). GERD is a condition where stomach acid backs up into your throat (esophagus).  Exercise or strenuous activity. SIGNS AND SYMPTOMS   Wheezing.  Excessive coughing, particularly at night.  Chest  tightness.  Shortness of breath. DIAGNOSIS  Your health care provider will ask you about your medical history and perform a physical exam. A chest X-ray or blood testing may be performed to look for other causes of your symptoms or other conditions that may have triggered your asthma attack. TREATMENT  Treatment is aimed at reducing inflammation and opening up the airways in your lungs. Most asthma attacks are treated with inhaled medicines. These include quick relief or rescue medicines (such as bronchodilators) and controller medicines (such as inhaled corticosteroids). These medicines are sometimes given through an inhaler or a nebulizer. Systemic steroid medicine taken by mouth or given through an IV tube also can be used to reduce the inflammation when an attack is moderate or severe. Antibiotic medicines are only used if a bacterial infection is present.  HOME CARE INSTRUCTIONS   Rest.  Drink plenty of liquids. This helps the mucus to remain thin and be easily coughed up. Only use caffeine in moderation and do not use alcohol until you have recovered from your illness.  Do not smoke. Avoid being exposed to secondhand smoke.  You play a critical role in keeping yourself in good health. Avoid exposure to things that cause you to wheeze or to have breathing problems.  Keep your medicines up to date and available. Carefully follow your health care provider's treatment plan.  Take your medicine exactly as prescribed.  When pollen or pollution is bad, keep windows closed and use an air conditioner or go to places with air conditioning.  Asthma requires careful medical care. See your health care provider for a follow-up as advised. If you are more than  [redacted] weeks pregnant and you were prescribed any new medicines, let your obstetrician know about the visit and how you are doing. Follow-up with your health care provider as directed.  After you have recovered from your asthma attack, make an  appointment with your outpatient doctor to talk about ways to reduce the likelihood of future attacks. If you do not have a doctor who manages your asthma, make an appointment with a primary care doctor to discuss your asthma. SEEK IMMEDIATE MEDICAL CARE IF:   You are getting worse.  You have trouble breathing. If severe, call your local emergency services (911 in the [place]  You develop chest pain or discomfort.  You are vomiting.  You are not able to keep fluids down.  You are coughing up yellow, green, brown, or bloody sputum.  You have a fever and your symptoms suddenly get worse.  You have trouble swallowing. MAKE SURE YOU:   Understand these instructions.  Will watch your condition.  Will get help right away if you are not doing well or get worse. Document Released: 03/09/2007 Document Revised: 07/25/2013 Document Reviewed: 05/30/2013 [facility] Patient Information 2014 [place], [place].

## 2013-11-13 NOTE — Progress Notes (Signed)
Subjective:    Patient ID: Andrea Bonilla, female    DOB: [birth year], 42 y.o.   MRN: [id number]  HPI Comments: Treatment 1 month ago for URI with conservative measures. Fever has resolved, sputum has resolved persisting dry cough -especially at night or outside exposure.  Cough This is a chronic problem. The current episode started 1 to 4 weeks ago. The problem has been waxing and waning. The problem occurs hourly. The cough is non-productive. Associated symptoms include nasal congestion, postnasal drip and shortness of breath. Pertinent negatives include no chest pain, chills, ear congestion, fever, headaches, heartburn, hemoptysis, myalgias, sore throat or weight loss. Wheezing: occ. The symptoms are aggravated by lying down and cold air. She has tried prescription cough suppressant for the symptoms. The treatment provided moderate relief. Her past medical history is significant for asthma.   Past Medical History  Diagnosis Date  . ALLERGIC RHINITIS   . ASTHMA   . HYPERLIPIDEMIA   . OSTEOPENIA   . STRESS FRACTURE, FOOT   . Splenic lesion     Review of Systems  Constitutional: Negative for fever, chills and weight loss.  HENT: Positive for postnasal drip. Negative for sore throat.   Respiratory: Positive for cough and shortness of breath. Negative for hemoptysis. Wheezing: occ.   Cardiovascular: Negative for chest pain.  Gastrointestinal: Negative for heartburn.  Musculoskeletal: Negative for myalgias.  Neurological: Negative for headaches.       Objective:   Physical Exam  BP 112/80  Pulse 59  Temp(Src) 98.8 F (37.1 C) (Oral)  SpO2 98% Wt Readings from Last 3 Encounters:  10/15/13 189 lb (85.73 kg)  09/14/13 186 lb 6 oz (84.539 kg)  07/23/13 184 lb (83.462 kg)   Constitutional: She appears well-developed and well-nourished. No distress.  HENT: Head: Normocephalic and atraumatic. Ears: B TMs ok, no erythema or effusion; Nose: Nose normal. Mouth/Throat: Oropharynx is  clear and moist. No oropharyngeal exudate.  Eyes: Conjunctivae and EOM are normal. Pupils are equal, round, and reactive to light. No scleral icterus.  Neck: Normal range of motion. Neck supple. No JVD present. No thyromegaly present.  Cardiovascular: Normal rate, regular rhythm and normal heart sounds.  No murmur heard. No BLE edema. Pulmonary/Chest: Effort normal and breath sounds normal. No respiratory distress. She has no wheezes.  Skin: Skin is warm and dry. No rash noted. No erythema.  Psychiatric: She has a normal mood and affect. Her behavior is normal. Judgment and thought content normal.   Lab Results  Component Value Date   WBC 7.6 09/05/2013   HGB 12.6 09/05/2013   HCT 36.9 09/05/2013   PLT 216.0 09/05/2013   GLUCOSE 84 09/05/2013   CHOL 241* 09/05/2013   TRIG 213.0* 09/05/2013   HDL 64.60 09/05/2013   LDLDIRECT 156.8 09/05/2013   ALT 17 09/05/2013   AST 16 09/05/2013   NA 139 09/05/2013   K 3.6 09/05/2013   CL 104 09/05/2013   CREATININE 0.9 09/05/2013   BUN 11 09/05/2013   CO2 30 09/05/2013   TSH 2.45 09/05/2013   INR 0.89 06/17/2011        Assessment & Plan:   Asthmatic bronchitis with acute exacerbation ongoing  No evidence for pneumonia on exam but will treat with azithromycin for possible "walking pneumonia" causing persisting exacerbation  IM Medrol 80mg  treatment today Resume rescue albuterol inhaler as needed for cough as well as for "wheeze"  Okay to continue Tussionex at bedtime as needed, but suspect we'll not need once asthma  symptoms controlled

## 2013-11-13 NOTE — Progress Notes (Signed)
Pre-visit discussion using our clinic review tool. No additional management support is needed unless otherwise documented below in the visit note.  

## 2013-12-10 ENCOUNTER — Encounter: Payer: Self-pay | Admitting: Internal Medicine

## 2013-12-10 ENCOUNTER — Ambulatory Visit (INDEPENDENT_AMBULATORY_CARE_PROVIDER_SITE_OTHER): Payer: BC Managed Care – PPO | Admitting: Internal Medicine

## 2013-12-10 VITALS — BP 146/82 | HR 80 | Temp 99.3°F | Resp 16 | Ht 66.5 in | Wt 190.0 lb

## 2013-12-10 DIAGNOSIS — J019 Acute sinusitis, unspecified: Secondary | ICD-10-CM | POA: Insufficient documentation

## 2013-12-10 MED ORDER — AMOXICILLIN 875 MG PO TABS: 875.0000 mg | ORAL_TABLET | Freq: Two times a day (BID) | ORAL | Status: DC

## 2013-12-10 NOTE — Progress Notes (Signed)
Pre visit review using our clinic review tool, if applicable. No additional management support is needed unless otherwise documented below in the visit note. 

## 2013-12-10 NOTE — Patient Instructions (Signed)

## 2013-12-11 NOTE — Assessment & Plan Note (Signed)
I will treat the infection with amoxil 

## 2013-12-11 NOTE — Progress Notes (Signed)
Subjective:    Patient ID: Andrea Bonilla, female    DOB: [birth year], 43 y.o.   MRN: [id number]  Sinusitis This is a recurrent problem. The current episode started 1 to 4 weeks ago. The problem has been gradually worsening since onset. The maximum temperature recorded prior to her arrival was 100 - 100.9 F. The fever has been present for 3 to 4 days. Her pain is at a severity of 0/10. She is experiencing no pain. Associated symptoms include chills, congestion, sinus pressure and a sore throat. Pertinent negatives include no coughing, diaphoresis, ear pain, headaches, hoarse voice, neck pain, shortness of breath, sneezing or swollen glands. Past treatments include oral decongestants. The treatment provided mild relief.      Review of Systems  Constitutional: Positive for chills. Negative for fever, diaphoresis, activity change, appetite change, fatigue and unexpected weight change.  HENT: Positive for congestion, postnasal drip, rhinorrhea, sinus pressure and sore throat. Negative for ear pain, facial swelling, hoarse voice, mouth sores, nosebleeds, sneezing, tinnitus, trouble swallowing and voice change.   Eyes: Negative.   Respiratory: Negative.  Negative for cough and shortness of breath.   Cardiovascular: Negative.  Negative for chest pain, palpitations and leg swelling.  Gastrointestinal: Negative.  Negative for nausea, vomiting, abdominal pain, diarrhea, constipation and blood in stool.  Endocrine: Negative.   Genitourinary: Negative.   Musculoskeletal: Negative.  Negative for neck pain.  Skin: Negative.   Allergic/Immunologic: Negative.   Neurological: Negative.  Negative for headaches.  Hematological: Negative.  Negative for adenopathy. Does not bruise/bleed easily.  Psychiatric/Behavioral: Negative.        Objective:   Physical Exam  Vitals reviewed. Constitutional: She is oriented to person, place, and time. She appears well-developed and well-nourished.  Non-toxic  appearance. She does not have a sickly appearance. She does not appear ill. No distress.  HENT:  Right Ear: Hearing, tympanic membrane, external ear and ear canal normal.  Left Ear: Hearing, tympanic membrane, external ear and ear canal normal.  Nose: Mucosal edema and rhinorrhea present. Right sinus exhibits maxillary sinus tenderness. Right sinus exhibits no frontal sinus tenderness. Left sinus exhibits maxillary sinus tenderness. Left sinus exhibits no frontal sinus tenderness.  Mouth/Throat: Oropharynx is clear and moist and mucous membranes are normal. Mucous membranes are not pale, not dry and not cyanotic. No oral lesions. No trismus in the jaw. No uvula swelling. No oropharyngeal exudate, posterior oropharyngeal edema, posterior oropharyngeal erythema or tonsillar abscesses.  Eyes: Conjunctivae are normal. Right eye exhibits no discharge. Left eye exhibits no discharge. No scleral icterus.  Neck: Normal range of motion. Neck supple. No JVD present. No tracheal deviation present. No thyromegaly present.  Cardiovascular: Normal rate, regular rhythm, normal heart sounds and intact distal pulses.  Exam reveals no gallop and no friction rub.   No murmur heard. Pulmonary/Chest: Effort normal and breath sounds normal. No stridor. No respiratory distress. She has no wheezes. She has no rales. She exhibits no tenderness.  Abdominal: Soft. Bowel sounds are normal. She exhibits no distension and no mass. There is no tenderness. There is no rebound and no guarding.  Musculoskeletal: Normal range of motion. She exhibits no edema and no tenderness.  Lymphadenopathy:    She has no cervical adenopathy.  Neurological: She is oriented to person, place, and time.  Skin: Skin is warm and dry. No rash noted. She is not diaphoretic. No erythema. No pallor.  Psychiatric: She has a normal mood and affect. Her behavior is normal. Judgment and thought  content normal.          Assessment & Plan:

## 2014-01-21 ENCOUNTER — Telehealth: Payer: Self-pay | Admitting: Internal Medicine

## 2014-01-21 NOTE — Telephone Encounter (Signed)
Patient believes she may have a yeast infection and is calling to see if an rx can be sent to [facility] on [address] for it. Please advise.

## 2014-01-23 MED ORDER — FLUCONAZOLE 150 MG PO TABS
ORAL_TABLET | ORAL | Status: DC
Start: 2014-01-23 — End: 2014-04-22

## 2014-01-23 NOTE — Telephone Encounter (Signed)
Done erx 

## 2014-01-23 NOTE — Telephone Encounter (Signed)
Patient informed. 

## 2014-04-22 ENCOUNTER — Ambulatory Visit (INDEPENDENT_AMBULATORY_CARE_PROVIDER_SITE_OTHER): Payer: Managed Care, Other (non HMO) | Admitting: Family Medicine

## 2014-04-22 ENCOUNTER — Encounter: Payer: Self-pay | Admitting: Family Medicine

## 2014-04-22 VITALS — BP 100/68 | HR 85 | Temp 98.8°F | Ht 66.5 in | Wt 194.0 lb

## 2014-04-22 DIAGNOSIS — L255 Unspecified contact dermatitis due to plants, except food: Secondary | ICD-10-CM

## 2014-04-22 MED ORDER — PREDNISONE 10 MG PO TABS: ORAL_TABLET | ORAL | Status: DC

## 2014-04-22 NOTE — Patient Instructions (Signed)
Poison Ivy Poison ivy is a inflammation of the skin (contact dermatitis) caused by touching the allergens on the leaves of the ivy plant following previous exposure to the plant. The rash usually appears 48 hours after exposure. The rash is usually bumps (papules) or blisters (vesicles) in a linear pattern. Depending on your own sensitivity, the rash may simply cause redness and itching, or it may also progress to blisters which may break open. These must be well cared for to prevent secondary bacterial (germ) infection, followed by scarring. Keep any open areas dry, clean, dressed, and covered with an antibacterial ointment if needed. The eyes may also get puffy. The puffiness is worst in the morning and gets better as the day progresses. This dermatitis usually heals without scarring, within 2 to 3 weeks without treatment. HOME CARE INSTRUCTIONS  Thoroughly wash with soap and water as soon as you have been exposed to poison ivy. You have about one half hour to remove the plant resin before it will cause the rash. This washing will destroy the oil or antigen on the skin that is causing, or will cause, the rash. Be sure to wash under your fingernails as any plant resin there will continue to spread the rash. Do not rub skin vigorously when washing affected area. Poison ivy cannot spread if no oil from the plant remains on your body. A rash that has progressed to weeping sores will not spread the rash unless you have not washed thoroughly. It is also important to wash any clothes you have been wearing as these may carry active allergens. The rash will return if you wear the unwashed clothing, even several days later. Avoidance of the plant in the future is the best measure. Poison ivy plant can be recognized by the number of leaves. Generally, poison ivy has three leaves with flowering branches on a single stem. Diphenhydramine may be purchased over the counter and used as needed for itching. Do not drive with  this medication if it makes you drowsy.Ask your caregiver about medication for children. SEEK MEDICAL CARE IF:  Open sores develop.  Redness spreads beyond area of rash.  You notice purulent (pus-like) discharge.  You have increased pain.  Other signs of infection develop (such as fever). Document Released: 11/19/2000 Document Revised: 02/14/2012 Document Reviewed: 10/08/2009 [employer] Patient Information 2014 [employer].  

## 2014-04-22 NOTE — Progress Notes (Signed)
Pre visit review using our clinic review tool, if applicable. No additional management support is needed unless otherwise documented below in the visit note. 

## 2014-04-22 NOTE — Progress Notes (Signed)
No chief complaint on file.   HPI:  Acute visit for "Poison Ivy": -PCP, Dr. Cathlean Cower, unavailable -reports:thinks got it from dog whom goes in the woods, spot started last week on side of body, neck, hand and cheek - very itchy -reports allergic to poison ivy and usually does shot -denies: SOB, lesions in eye, nose or mouth, fevers  ROS: See pertinent positives and negatives per HPI.  Past Medical History  Diagnosis Date  . ALLERGIC RHINITIS   . ASTHMA   . HYPERLIPIDEMIA   . OSTEOPENIA   . STRESS FRACTURE, FOOT   . Splenic lesion     Past Surgical History  Procedure Laterality Date  . Skin graft right forehead    . Breast surgery      Family History  Problem Relation Age of Onset  . Coronary artery disease Other     1st degree relative female <50  . Diabetes Other   . Hypertension Other   . Stroke Other   . Hypertension Mother   . Heart disease Mother   . Hyperlipidemia Mother     History   Social History  . Marital Status: Married    Spouse Name: N/A    Number of Children: N/A  . Years of Education: N/A   Occupational History  . Communication specialist [employer]    Social History Main Topics  . Smoking status: Former Research scientist (life sciences)  . Smokeless tobacco: None  . Alcohol Use: Yes  . Drug Use: No  . Sexual Activity: None   Other Topics Concern  . None   Social History Narrative  . None    Current outpatient prescriptions:albuterol (PROVENTIL HFA;VENTOLIN HFA) 108 (90 BASE) MCG/ACT inhaler, Inhale 2 puffs into the lungs every 6 (six) hours as needed for wheezing or shortness of breath (or cough)., Disp: 1 Inhaler, Rfl: 1;  Calcium Carbonate-Vitamin D (CALCIUM-VITAMIN D) 500-200 MG-UNIT per tablet, Take 1 tablet by mouth 2 (two) times daily with a meal.  , Disp: , Rfl:  chlorpheniramine-HYDROcodone (TUSSIONEX PENNKINETIC ER) 10-8 MG/5ML LQCR, Take 5 mLs by mouth every 12 (twelve) hours as needed for cough., Disp: 140 mL, Rfl: 0;  Cholecalciferol (VITAMIN D3) 1000  UNITS CAPS, Take 1 capsule by mouth daily.  , Disp: , Rfl: ;  Cyanocobalamin (VITAMIN B 12 PO), Take 500 mg by mouth daily.  , Disp: , Rfl: ;  fexofenadine (ALLEGRA) 180 MG tablet, Take 180 mg by mouth daily.  , Disp: , Rfl:  Fluticasone-Salmeterol (ADVAIR DISKUS) 250-50 MCG/DOSE AEPB, Inhale 1 puff into the lungs 2 (two) times daily., Disp: 3 each, Rfl: 3;  Multiple Vitamin (MULTIVITAMIN) tablet, Take 1 tablet by mouth daily.  , Disp: , Rfl: ;  norgestimate-ethinyl estradiol (ORTHO-CYCLEN) 0.25-35 MG-MCG per tablet, Take 1 tablet by mouth daily.  , Disp: , Rfl:  predniSONE (DELTASONE) 10 MG tablet, Take 50mg  (5 tablets) daily for 3 days, then 40mg  (4 tablets) daily for 3 days, then 30mg (3 tabs)daily for 3 days, then 20mg  (2 tabs) daily for 3 days, then 10mg  (1tab) daily for 3 days, Disp: 55 tablet, Rfl: 0  EXAM:  Filed Vitals:   04/22/14 1416  BP: 100/68  Pulse: 85  Temp: 98.8 F (37.1 C)    Body mass index is 30.85 kg/(m^2).  GENERAL: vitals reviewed and listed above, alert, oriented, appears well hydrated and in no acute distress  HEENT: atraumatic, conjunttiva clear, no obvious abnormalities on inspection of external nose and ears  NECK: no obvious masses on inspection  LUNGS: clear to auscultation bilaterally, no wheezes, rales or rhonchi, good air movement  CV: HRRR, no peripheral edema  SKIN: patchy vesicular rash on trunk, hands, arms, small sopt on forehead not near eye  MS: moves all extremities without noticeable abnormality  PSYCH: pleasant and cooperative, no obvious depression or anxiety  ASSESSMENT AND PLAN:  Discussed the following assessment and plan:  Toxicodendron dermatitis - Plan: predniSONE (DELTASONE) 10 MG tablet  -discuss options and given on face andmultiple body areas she opted for prednisone after discussion of risks -return and emergent precautions discussed -Patient advised to return or notify a doctor immediately if symptoms worsen or persist or  new concerns arise.  Patient Instructions  Poison [facility] ivy is a inflammation of the skin (contact dermatitis) caused by touching the allergens on the leaves of the ivy plant following previous exposure to the plant. The rash usually appears 48 hours after exposure. The rash is usually bumps (papules) or blisters (vesicles) in a linear pattern. Depending on your own sensitivity, the rash may simply cause redness and itching, or it may also progress to blisters which may break open. These must be well cared for to prevent secondary bacterial (germ) infection, followed by scarring. Keep any open areas dry, clean, dressed, and covered with an antibacterial ointment if needed. The eyes may also get puffy. The puffiness is worst in the morning and gets better as the day progresses. This dermatitis usually heals without scarring, within 2 to 3 weeks without treatment. HOME CARE INSTRUCTIONS  Thoroughly wash with soap and water as soon as you have been exposed to poison ivy. You have about one half hour to remove the plant resin before it will cause the rash. This washing will destroy the oil or antigen on the skin that is causing, or will cause, the rash. Be sure to wash under your fingernails as any plant resin there will continue to spread the rash. Do not rub skin vigorously when washing affected area. Poison ivy cannot spread if no oil from the plant remains on your body. A rash that has progressed to weeping sores will not spread the rash unless you have not washed thoroughly. It is also important to wash any clothes you have been wearing as these may carry active allergens. The rash will return if you wear the unwashed clothing, even several days later. Avoidance of the plant in the future is the best measure. Poison ivy plant can be recognized by the number of leaves. Generally, poison ivy has three leaves with flowering branches on a single stem. Diphenhydramine may be purchased over the counter and  used as needed for itching. Do not drive with this medication if it makes you drowsy.Ask your caregiver about medication for children. SEEK MEDICAL CARE IF:  Open sores develop.  Redness spreads beyond area of rash.  You notice purulent (pus-like) discharge.  You have increased pain.  Other signs of infection develop (such as fever). Document Released: 11/19/2000 Document Revised: 02/14/2012 Document Reviewed: 10/08/2009 [facility] Patient Information 2014 [place], [place].      Andrea Bonilla

## 2014-05-01 ENCOUNTER — Other Ambulatory Visit: Payer: Self-pay

## 2014-05-01 ENCOUNTER — Other Ambulatory Visit: Payer: Self-pay | Admitting: Obstetrics and Gynecology

## 2014-05-01 DIAGNOSIS — N63 Unspecified lump in unspecified breast: Secondary | ICD-10-CM

## 2014-05-01 DIAGNOSIS — Z1231 Encounter for screening mammogram for malignant neoplasm of breast: Secondary | ICD-10-CM

## 2014-05-07 ENCOUNTER — Telehealth: Payer: Self-pay | Admitting: *Deleted

## 2014-05-07 MED ORDER — FLUTICASONE-SALMETEROL 250-50 MCG/DOSE IN AEPB: 1.0000 | INHALATION_SPRAY | Freq: Two times a day (BID) | RESPIRATORY_TRACT | Status: DC

## 2014-05-07 NOTE — Telephone Encounter (Signed)
Pt called requesting 90 day Rx of Advair.  She is wanting to pick up Rx to mail it to Mail order pharmacy.

## 2014-05-07 NOTE — Telephone Encounter (Signed)
Done hardcopy to robin  

## 2014-05-08 NOTE — Telephone Encounter (Signed)
Called the patient informed hardcopy is ready for pickup

## 2014-05-16 ENCOUNTER — Ambulatory Visit
Admission: RE | Admit: 2014-05-16 | Discharge: 2014-05-16 | Disposition: A | Discharge status: Home / Self Care | Payer: Managed Care, Other (non HMO) | Source: Ambulatory Visit | Attending: Obstetrics and Gynecology | Admitting: Obstetrics and Gynecology

## 2014-05-16 ENCOUNTER — Ambulatory Visit: Payer: Managed Care, Other (non HMO)

## 2014-05-16 DIAGNOSIS — N63 Unspecified lump in unspecified breast: Secondary | ICD-10-CM

## 2014-07-29 ENCOUNTER — Telehealth: Payer: Self-pay | Admitting: Internal Medicine

## 2014-07-29 NOTE — Telephone Encounter (Signed)
Pt states she has burning while urinating, urine is cloudy, and she has abdominal pain. Please advise where I can schedule her for tomorrow with Dr. [name]

## 2014-07-29 NOTE — Telephone Encounter (Signed)
appt has been scheduled w/ dr. [name] on 07/31/14 @ 11:30 am

## 2014-07-31 ENCOUNTER — Encounter: Payer: Self-pay | Admitting: Internal Medicine

## 2014-07-31 ENCOUNTER — Ambulatory Visit (INDEPENDENT_AMBULATORY_CARE_PROVIDER_SITE_OTHER): Payer: Managed Care, Other (non HMO) | Admitting: Internal Medicine

## 2014-07-31 ENCOUNTER — Other Ambulatory Visit: Payer: Managed Care, Other (non HMO)

## 2014-07-31 VITALS — BP 108/80 | HR 77 | Temp 98.2°F | Wt 192.2 lb

## 2014-07-31 DIAGNOSIS — L259 Unspecified contact dermatitis, unspecified cause: Secondary | ICD-10-CM

## 2014-07-31 DIAGNOSIS — N390 Urinary tract infection, site not specified: Secondary | ICD-10-CM

## 2014-07-31 DIAGNOSIS — B001 Herpesviral vesicular dermatitis: Secondary | ICD-10-CM | POA: Insufficient documentation

## 2014-07-31 DIAGNOSIS — Z23 Encounter for immunization: Secondary | ICD-10-CM

## 2014-07-31 DIAGNOSIS — B009 Herpesviral infection, unspecified: Secondary | ICD-10-CM

## 2014-07-31 LAB — POCT URINALYSIS DIPSTICK
BILIRUBIN UA: NEGATIVE
Glucose, UA: NEGATIVE
Ketones, UA: NEGATIVE
NITRITE UA: NEGATIVE
Protein, UA: NEGATIVE
Spec Grav, UA: 1.015
Urobilinogen, UA: 4
pH, UA: 6

## 2014-07-31 MED ORDER — TRIAMCINOLONE ACETONIDE 0.1 % EX CREA: 1.0000 "application " | TOPICAL_CREAM | Freq: Two times a day (BID) | CUTANEOUS | Status: DC

## 2014-07-31 MED ORDER — LEVOFLOXACIN 500 MG PO TABS: 500.0000 mg | ORAL_TABLET | Freq: Every day | ORAL | Status: DC

## 2014-07-31 MED ORDER — FLUCONAZOLE 150 MG PO TABS: ORAL_TABLET | ORAL | Status: DC

## 2014-07-31 NOTE — Patient Instructions (Addendum)
You had the flu shot today  Please take all new medication as prescribed  - the antibiotic, and the cream for the rash  Please continue all other medications as before, and refills have been done if requested.  Please have the pharmacy call with any other refills you may need.  Please keep your appointments with your specialists as you may have planned  Your specimen will be sent for culture, to help make sure the antiobiotic is correct

## 2014-07-31 NOTE — Assessment & Plan Note (Signed)
Ok to cont the abreva, no need to take son's or other valtrex at this time

## 2014-07-31 NOTE — Assessment & Plan Note (Signed)
With relatively few discrete areas, for triam cr prn,  to f/u any worsening symptoms or concerns

## 2014-07-31 NOTE — Progress Notes (Signed)
Pre visit review using our clinic review tool, if applicable. No additional management support is needed unless otherwise documented below in the visit note. 

## 2014-07-31 NOTE — Assessment & Plan Note (Addendum)
Mild to mod, for antibx course,  to f/u any worsening symptoms or concerns, for urine cx as well, also diflucan prn yeast infxn per pt request

## 2014-07-31 NOTE — Progress Notes (Signed)
   Subjective:    Patient ID: Andrea Bonilla, female    DOB: [birth year], 43 y.o.   MRN: [id number]  HPI  Here to f/u with 1 wk dysuria, freq, urgency with pain to low mid abd radiating to back, with urinary odor and cloudy. No f/c, and Pt denies chest pain, increased sob or doe, wheezing, orthopnea, PND, increased LE swelling, palpitations, dizziness or syncope.  Pt denies new neurological symptoms such as new headache, or facial or extremity weakness or numbness   Pt denies polydipsia, polyuria.  Also incidentally with mult area itchy red rash after working near [place] in the yard.  Also with right lower lip fever blister, using abreva otc asd., but ? Worsening, so took her sons valtrex x 1 last night.  Due for flu shot Past Medical History  Diagnosis Date  . ALLERGIC RHINITIS   . ASTHMA   . HYPERLIPIDEMIA   . OSTEOPENIA   . STRESS FRACTURE, FOOT   . Splenic lesion    Past Surgical History  Procedure Laterality Date  . Skin graft right forehead    . Breast surgery      reports that she has quit smoking. She does not have any smokeless tobacco history on file. She reports that she drinks alcohol. She reports that she does not use illicit drugs. family history includes Coronary artery disease in her other; Diabetes in her other; Heart disease in her mother; Hyperlipidemia in her mother; Hypertension in her mother and other; Stroke in her other. Allergies  Allergen Reactions  . Clarithromycin     REACTION: nausea  . Codeine   . Erythromycin   . Moxifloxacin     REACTION: nausea   Review of Systems  Constitutional: Negative for unusual diaphoresis or other sweats  HENT: Negative for ringing in ear Eyes: Negative for double vision or worsening visual disturbance.  Respiratory: Negative for choking and stridor.   Gastrointestinal: Negative for vomiting or other signifcant bowel change Genitourinary: Negative for hematuria or decreased urine volume.  Musculoskeletal: Negative for  other MSK pain or swelling Skin: Negative for color change and worsening wound.  Neurological: Negative for tremors and numbness other than noted  Psychiatric/Behavioral: Negative for decreased concentration or agitation other than above       Objective:   Physical Exam BP 108/80  Pulse 77  Temp(Src) 98.2 F (36.8 C) (Oral)  Wt 192 lb 4 oz (87.204 kg)  SpO2 97% VS noted, mild ill Constitutional: Pt appears well-developed, well-nourished.  HENT: Head: NCAT.  Right Ear: External ear normal.  Left Ear: External ear normal.  Eyes: . Pupils are equal, round, and reactive to light. Conjunctivae and EOM are normal Neck: Normal range of motion. Neck supple.  Cardiovascular: Normal rate and regular rhythm.   Pulmonary/Chest: Effort normal and breath sounds normal.  Abd:  Soft, ND, + BS, tender mid low abd, without guarding or rebound, no flank tender bilat Spine nontender, no swelling, rash, or paravertebral spasm/tender Neurological: Pt is alert. Not confused , motor grossly intact Skin: Skin is warm.with approx 10 discrete areas nontender erythem weepy areas to distal arm, distal RLE and left upp chest near mid clavical Small area mult raised vesicles on erythem base to right lower lateral lip Psychiatric: Pt behavior is normal. No agitation.     Assessment & Plan:

## 2014-08-02 LAB — URINE CULTURE: Colony Count: >=100000

## 2014-08-05 ENCOUNTER — Telehealth: Payer: Self-pay | Admitting: *Deleted

## 2014-08-05 NOTE — Telephone Encounter (Signed)
[facility] Triage Call Report Triage Record Num: [id number] Operator: Genevieve Norlander Patient Name: Andrea Bonilla Call Date & Time: 08/03/2014 1:35:28PM Patient Phone: [phone] PCP: Cathlean Cower Patient Gender: Female PCP Fax : [phone] Patient DOB: July 02, 1971 Practice Name: [facility] Reason for Call: Caller: [name]/Patient; PCP: Cathlean Cower (Adults only); CB#: [phone]; Call regarding poison oak itchy rash on arms when in office on 07/31/14, rash spreading - now covers abdomen, both arms and legs; treated with Benadryl, Calamine lotion, Kenalog cream; LMP: 07/30/14; spoke with on call provider Dr. Delma Freeze - prescribe Medro Dose pack with no refills and to continue with Benadryl and Kenalog cream; called to [facility] on [address] [phone], will be ready for pick up by 1500; Guideline: Poison Ivy, [place], or [place] Exposure with disposition of see provider within 4 hours for involvement of 25 percent or more of body surface area override to call provider now per pt/[name] request. Protocol(s) Used: Poison Ivy, [place], or [employer] Exposure Recommended Outcome per Protocol: See Provider within 4 hours Override Outcome if Used in Protocol: Call Provider Immediately RN Reason for Override Outcome: Per [employer]. Reason for Outcome: Involvement of 25% or more of body surface area Care Advice: ~ Resting will help avoid overheating and sweating which will intensify the irritation. Apply cool compresses to affected area(s) for 20 minutes 4 to 6 times daily to help relieve itching and provide a topical anesthesia. ~ ~ Call provider if symptoms worsen or new symptoms develop. Calamine is appropriate if used as directed on the label or by a pharmacist. DO NOT use topical preparations with benzocaine because they can be sensitizing agents and can cause an allergic reaction. ~ ~ SYMPTOM / CONDITION MANAGEMENT ~ CAUTIONS Itching Relief: - Avoid scratching or rubbing irritated area;  may cause further irritation and secondary infection - Take cool showers or baths several times a day to relieve itching; do not use soap - If cool water alone does not relieve itching, try adding 1/2 to 1 cup baking soda to bath water - Follow with application of a bland lotion such as calamine (do not apply to the eyes or genitals). ~ For symptom relief, consider nonprescription antihistamines (such as Allerest, Allegra, Claritin, Zyrtec, Chlor-Trimetron, Benadryl, etc.) as directed on label or by pharmacist. Drowsiness may result, especially in geriatric patients. Non-sedating antihistamines are available without a prescription. ~ 08/03/2014 2:22:02PM Page 1 of 1 [id number]

## 2014-08-13 ENCOUNTER — Other Ambulatory Visit: Payer: Self-pay | Admitting: Internal Medicine

## 2014-08-13 ENCOUNTER — Telehealth: Payer: Self-pay

## 2014-08-13 MED ORDER — CEPHALEXIN 500 MG PO CAPS: 500.0000 mg | ORAL_CAPSULE | Freq: Four times a day (QID) | ORAL | Status: DC

## 2014-08-13 NOTE — Telephone Encounter (Signed)
The patient is still having some UTI symptoms.  Was treated for both poison ivy and UTI at last OV.  Prednisone was prescribed for poison ivy and PCP informed it may delay levaquin helping with UTI.  She is still having UTI symptoms and would like to know if PCP would refill levaquin for that please.  Call back number [phone]

## 2014-08-13 NOTE — Telephone Encounter (Signed)
[place] for change antibx - done erx

## 2014-08-13 NOTE — Telephone Encounter (Signed)
Patient informed. 

## 2014-10-08 ENCOUNTER — Other Ambulatory Visit: Payer: Self-pay | Admitting: Obstetrics and Gynecology

## 2014-10-09 LAB — CYTOLOGY - PAP

## 2014-11-08 ENCOUNTER — Encounter: Payer: Self-pay | Admitting: Internal Medicine

## 2014-11-08 ENCOUNTER — Other Ambulatory Visit (INDEPENDENT_AMBULATORY_CARE_PROVIDER_SITE_OTHER): Payer: Managed Care, Other (non HMO)

## 2014-11-08 ENCOUNTER — Ambulatory Visit (INDEPENDENT_AMBULATORY_CARE_PROVIDER_SITE_OTHER): Payer: Managed Care, Other (non HMO) | Admitting: Internal Medicine

## 2014-11-08 VITALS — BP 112/70 | HR 81 | Temp 98.6°F | Ht 66.0 in | Wt 194.2 lb

## 2014-11-08 DIAGNOSIS — R300 Dysuria: Secondary | ICD-10-CM

## 2014-11-08 DIAGNOSIS — Z0000 Encounter for general adult medical examination without abnormal findings: Secondary | ICD-10-CM

## 2014-11-08 DIAGNOSIS — Z8371 Family history of colonic polyps: Secondary | ICD-10-CM | POA: Insufficient documentation

## 2014-11-08 DIAGNOSIS — D7389 Other diseases of spleen: Secondary | ICD-10-CM

## 2014-11-08 DIAGNOSIS — Z83719 Family history of colon polyps, unspecified: Secondary | ICD-10-CM | POA: Insufficient documentation

## 2014-11-08 DIAGNOSIS — Z23 Encounter for immunization: Secondary | ICD-10-CM

## 2014-11-08 DIAGNOSIS — D739 Disease of spleen, unspecified: Secondary | ICD-10-CM

## 2014-11-08 DIAGNOSIS — E785 Hyperlipidemia, unspecified: Secondary | ICD-10-CM

## 2014-11-08 LAB — LACTATE DEHYDROGENASE: LDH: 136 U/L (ref 94–250)

## 2014-11-08 MED ORDER — FLUTICASONE-SALMETEROL 250-50 MCG/DOSE IN AEPB: 1.0000 | INHALATION_SPRAY | Freq: Two times a day (BID) | RESPIRATORY_TRACT | Status: DC

## 2014-11-08 NOTE — Progress Notes (Signed)
Pre visit review using our clinic review tool, if applicable. No additional management support is needed unless otherwise documented below in the visit note. 

## 2014-11-08 NOTE — Patient Instructions (Signed)
You had the Tdap (tetanus) shot today  Please continue all other medications as before, and refills have been done if requested.  Please have the pharmacy call with any other refills you may need.  Please continue your efforts at being more active, low cholesterol diet, and weight control.  You are otherwise up to date with prevention measures today.  Please keep your appointments with your specialists as you may have planned  Please go to the LAB in the Basement (turn left off the elevator) for the tests to be done today  You will be contacted by phone if any changes need to be made immediately.  Otherwise, you will receive a letter about your results with an explanation, but please check with MyChart first.  Please call if you change your mind about having the colonoscopy  Please remember to sign up for MyChart if you have not done so, as this will be important to you in the future with finding out test results, communicating by private email, and scheduling acute appointments online when needed.  Please return in 1 year for your yearly visit, or sooner if needed

## 2014-11-08 NOTE — Progress Notes (Signed)
Subjective:    Patient ID: Andrea Bonilla, female    DOB: 04/09/1971, 43 y.o.   MRN: [id number]  HPI  Here for wellness and f/u;  Overall doing ok;  Pt denies CP, worsening SOB, DOE, wheezing, orthopnea, PND, worsening LE edema, palpitations, dizziness or syncope.  Pt denies neurological change such as new headache, facial or extremity weakness.  Pt denies polydipsia, polyuria, or low sugar symptoms. Pt states overall good compliance with treatment and medications, good tolerability, and has been trying to follow lower cholesterol diet.  Pt denies worsening depressive symptoms, suicidal ideation or panic. No fever, night sweats, wt loss, loss of appetite, or other constitutional symptoms.  Pt states good ability with ADL's, has low fall risk, home safety reviewed and adequate, no other significant changes in hearing or vision, and only occasionally active with exercise.  Due for Tdap. Has had mild dyrusia for 3 days, but Denies urinary symptoms such as frequency, urgency, flank pain, hematuria or n/v, fever, chills.  Also mentions has FH brother, mother, and father all with colon polyps. Past Medical History  Diagnosis Date  . ALLERGIC RHINITIS   . ASTHMA   . HYPERLIPIDEMIA   . OSTEOPENIA   . STRESS FRACTURE, FOOT   . Splenic lesion    Past Surgical History  Procedure Laterality Date  . Skin graft right forehead    . Breast surgery      reports that she has quit smoking. She does not have any smokeless tobacco history on file. She reports that she drinks alcohol. She reports that she does not use illicit drugs. family history includes Coronary artery disease in her other; Diabetes in her other; Heart disease in her mother; Hyperlipidemia in her mother; Hypertension in her mother and other; Stroke in her other. Allergies  Allergen Reactions  . Clarithromycin     REACTION: nausea  . Codeine   . Erythromycin   . Moxifloxacin     REACTION: nausea   Current Outpatient Prescriptions on  File Prior to Visit  Medication Sig Dispense Refill  . albuterol (PROVENTIL HFA;VENTOLIN HFA) 108 (90 BASE) MCG/ACT inhaler Inhale 2 puffs into the lungs every 6 (six) hours as needed for wheezing or shortness of breath (or cough). 1 Inhaler 1  . Calcium Carbonate-Vitamin D (CALCIUM-VITAMIN D) 500-200 MG-UNIT per tablet Take 1 tablet by mouth 2 (two) times daily with a meal.      . cephALEXin (KEFLEX) 500 MG capsule Take 1 capsule (500 mg total) by mouth 4 (four) times daily. 40 capsule 0  . Cholecalciferol (VITAMIN D3) 1000 UNITS CAPS Take 1 capsule by mouth daily.      . Cyanocobalamin (VITAMIN B 12 PO) Take 500 mg by mouth daily.      . fexofenadine (ALLEGRA) 180 MG tablet Take 180 mg by mouth daily.      . fluconazole (DIFLUCAN) 150 MG tablet 1 tab every 3 days as needed 2 tablet 1  . levofloxacin (LEVAQUIN) 500 MG tablet Take 1 tablet (500 mg total) by mouth daily. 10 tablet 0  . Multiple Vitamin (MULTIVITAMIN) tablet Take 1 tablet by mouth daily.      . norgestimate-ethinyl estradiol (ORTHO-CYCLEN) 0.25-35 MG-MCG per tablet Take 1 tablet by mouth daily.      Marland Kitchen triamcinolone cream (KENALOG) 0.1 % Apply 1 application topically 2 (two) times daily. 30 g 0   No current facility-administered medications on file prior to visit.   Review of Systems Constitutional: Negative for increased diaphoresis, other  activity, appetite or other siginficant weight change  HENT: Negative for worsening hearing loss, ear pain, facial swelling, mouth sores and neck stiffness.   Eyes: Negative for other worsening pain, redness or visual disturbance.  Respiratory: Negative for shortness of breath and wheezing.   Cardiovascular: Negative for chest pain and palpitations.  Gastrointestinal: Negative for diarrhea, blood in stool, abdominal distention or other pain Genitourinary: Negative for hematuria, flank pain or change in urine volume.  Musculoskeletal: Negative for myalgias or other joint complaints.  Skin:  Negative for color change and wound.  Neurological: Negative for syncope and numbness. other than noted Hematological: Negative for adenopathy. or other swelling Psychiatric/Behavioral: Negative for hallucinations, self-injury, decreased concentration or other worsening agitation.      Objective:   Physical Exam BP 112/70 mmHg  Pulse 81  Temp(Src) 98.6 F (37 C) (Oral)  Ht 5\' 6"  (1.676 m)  Wt 194 lb 4 oz (88.111 kg)  BMI 31.37 kg/m2  SpO2 98% VS noted,  Constitutional: Pt is oriented to person, place, and time. Appears well-developed and well-nourished.  Head: Normocephalic and atraumatic.  Right Ear: External ear normal.  Left Ear: External ear normal.  Nose: Nose normal.  Mouth/Throat: Oropharynx is clear and moist.  Eyes: Conjunctivae and EOM are normal. Pupils are equal, round, and reactive to light.  Neck: Normal range of motion. Neck supple. No JVD present. No tracheal deviation present.  Cardiovascular: Normal rate, regular rhythm, normal heart sounds and intact distal pulses.   Pulmonary/Chest: Effort normal and breath sounds without rales or wheezing  Abdominal: Soft. Bowel sounds are normal. NT. No HSM  Musculoskeletal: Normal range of motion. Exhibits no edema.  Lymphadenopathy:  Has no cervical adenopathy.  Neurological: Pt is alert and oriented to person, place, and time. Pt has normal reflexes. No cranial nerve deficit. Motor grossly intact Skin: Skin is warm and dry. No rash noted.  Psychiatric:  Has normal mood and affect. Behavior is normal.     Assessment & Plan:

## 2014-11-09 LAB — CBC WITH DIFFERENTIAL/PLATELET
BASOS ABS: 0 10*3/uL (ref 0.0–0.1)
Basophils Relative: 0.3 % (ref 0.0–3.0)
Eosinophils Absolute: 0.1 10*3/uL (ref 0.0–0.7)
Eosinophils Relative: 1.1 % (ref 0.0–5.0)
HEMATOCRIT: 39.5 % (ref 36.0–46.0)
Hemoglobin: 13 g/dL (ref 12.0–15.0)
LYMPHS ABS: 1.8 10*3/uL (ref 0.7–4.0)
Lymphocytes Relative: 24.2 % (ref 12.0–46.0)
MCHC: 33 g/dL (ref 30.0–36.0)
MCV: 91.6 fl (ref 78.0–100.0)
MONO ABS: 0.4 10*3/uL (ref 0.1–1.0)
Monocytes Relative: 5.5 % (ref 3.0–12.0)
NEUTROS PCT: 68.9 % (ref 43.0–77.0)
Neutro Abs: 5.2 10*3/uL (ref 1.4–7.7)
Platelets: 202 10*3/uL (ref 150.0–400.0)
RBC: 4.31 Mil/uL (ref 3.87–5.11)
RDW: 12.5 % (ref 11.5–15.5)
WBC: 7.6 10*3/uL (ref 4.0–10.5)

## 2014-11-09 NOTE — Assessment & Plan Note (Signed)

## 2014-11-09 NOTE — Assessment & Plan Note (Signed)
For f/u [facility]

## 2014-11-09 NOTE — Assessment & Plan Note (Signed)
?   Significance, for UA with labs

## 2014-11-09 NOTE — Assessment & Plan Note (Signed)
Urged pt for colonoscopy, but declines at this time, may reconsider

## 2014-11-10 LAB — URINALYSIS, ROUTINE W REFLEX MICROSCOPIC
Bilirubin Urine: NEGATIVE
HGB URINE DIPSTICK: NEGATIVE
KETONES UR: NEGATIVE
Leukocytes, UA: NEGATIVE
Nitrite: NEGATIVE
Specific Gravity, Urine: 1.025 (ref 1.000–1.030)
Total Protein, Urine: NEGATIVE
URINE GLUCOSE: NEGATIVE
Urobilinogen, UA: 0.2 — AB (ref 0.0–1.0)
pH: 6 (ref 5.0–8.0)

## 2014-11-10 LAB — BASIC METABOLIC PANEL
BUN: 12 mg/dL (ref 6–23)
CHLORIDE: 104 meq/L (ref 96–112)
CO2: 24 mEq/L (ref 19–32)
Calcium: 9.2 mg/dL (ref 8.4–10.5)
Creatinine, Ser: 0.9 mg/dL (ref 0.4–1.2)
GFR: 72.02 mL/min (ref >60.00)
GLUCOSE: 74 mg/dL (ref 70–99)
POTASSIUM: 4.2 meq/L (ref 3.5–5.1)
SODIUM: 139 meq/L (ref 135–145)

## 2014-11-10 LAB — HEPATIC FUNCTION PANEL
ALK PHOS: 52 U/L (ref 39–117)
ALT: 13 U/L (ref 0–35)
AST: 18 U/L (ref 0–37)
Albumin: 3.6 g/dL (ref 3.5–5.2)
Bilirubin, Direct: 0.1 mg/dL (ref 0.0–0.3)
Total Bilirubin: 0.4 mg/dL (ref 0.2–1.2)
Total Protein: 6.9 g/dL (ref 6.0–8.3)

## 2014-11-10 LAB — LDL CHOLESTEROL, DIRECT: LDL DIRECT: 139.2 mg/dL

## 2014-11-10 LAB — LIPID PANEL
Cholesterol: 238 mg/dL — ABNORMAL HIGH (ref 0–200)
HDL: 65 mg/dL (ref >39.00)
NonHDL: 173
Total CHOL/HDL Ratio: 4
Triglycerides: 203 mg/dL — ABNORMAL HIGH (ref 0.0–149.0)
VLDL: 40.6 mg/dL — AB (ref 0.0–40.0)

## 2014-11-10 LAB — URINE CULTURE: Colony Count: 75000

## 2014-11-11 LAB — TSH: TSH: 2.98 u[IU]/mL (ref 0.35–4.50)

## 2014-11-12 ENCOUNTER — Other Ambulatory Visit: Payer: Self-pay | Admitting: Internal Medicine

## 2014-11-12 MED ORDER — NITROFURANTOIN MONOHYD MACRO 100 MG PO CAPS: 100.0000 mg | ORAL_CAPSULE | Freq: Two times a day (BID) | ORAL | Status: DC

## 2014-11-13 ENCOUNTER — Other Ambulatory Visit: Payer: Self-pay | Admitting: *Deleted

## 2014-11-13 MED ORDER — NITROFURANTOIN MONOHYD MACRO 100 MG PO CAPS: 100.0000 mg | ORAL_CAPSULE | Freq: Two times a day (BID) | ORAL | Status: DC

## 2015-01-08 ENCOUNTER — Other Ambulatory Visit: Payer: Self-pay | Admitting: Obstetrics and Gynecology

## 2015-01-08 DIAGNOSIS — N200 Calculus of kidney: Secondary | ICD-10-CM

## 2015-01-09 ENCOUNTER — Ambulatory Visit (HOSPITAL_COMMUNITY)
Admission: RE | Admit: 2015-01-09 | Discharge: 2015-01-09 | Disposition: A | Discharge status: Home / Self Care | Payer: Managed Care, Other (non HMO) | Source: Ambulatory Visit | Attending: Obstetrics and Gynecology | Admitting: Obstetrics and Gynecology

## 2015-01-09 DIAGNOSIS — N200 Calculus of kidney: Secondary | ICD-10-CM

## 2015-01-09 DIAGNOSIS — R109 Unspecified abdominal pain: Secondary | ICD-10-CM | POA: Diagnosis present

## 2015-04-22 ENCOUNTER — Other Ambulatory Visit: Payer: Self-pay | Admitting: Obstetrics and Gynecology

## 2015-04-23 LAB — CYTOLOGY - PAP

## 2015-06-13 ENCOUNTER — Other Ambulatory Visit: Payer: Self-pay | Admitting: Internal Medicine

## 2015-10-16 ENCOUNTER — Encounter: Payer: Self-pay | Admitting: Internal Medicine

## 2015-10-16 ENCOUNTER — Ambulatory Visit (INDEPENDENT_AMBULATORY_CARE_PROVIDER_SITE_OTHER): Payer: Managed Care, Other (non HMO) | Admitting: Internal Medicine

## 2015-10-16 VITALS — BP 128/80 | HR 73 | Temp 98.4°F | Resp 20 | Ht 66.25 in | Wt 197.5 lb

## 2015-10-16 DIAGNOSIS — D7389 Other diseases of spleen: Secondary | ICD-10-CM

## 2015-10-16 DIAGNOSIS — D739 Disease of spleen, unspecified: Secondary | ICD-10-CM

## 2015-10-16 DIAGNOSIS — Z0000 Encounter for general adult medical examination without abnormal findings: Secondary | ICD-10-CM | POA: Diagnosis not present

## 2015-10-16 MED ORDER — FLUTICASONE-SALMETEROL 250-50 MCG/DOSE IN AEPB: 1.0000 | INHALATION_SPRAY | Freq: Two times a day (BID) | RESPIRATORY_TRACT | Status: DC

## 2015-10-16 NOTE — Progress Notes (Signed)
Subjective:    Patient ID: Andrea Bonilla, female    DOB: Oct 03, 1971, 44 y.o.   MRN: [id number]  HPI  Here for wellness and f/u;  Overall doing ok;  Pt denies Chest pain, worsening SOB, DOE, wheezing, orthopnea, PND, worsening LE edema, palpitations, dizziness or syncope.  Pt denies neurological change such as new headache, facial or extremity weakness.  Pt denies polydipsia, polyuria, or low sugar symptoms. Pt states overall good compliance with treatment and medications, good tolerability, and has been trying to follow appropriate diet.  Pt denies worsening depressive symptoms, suicidal ideation or panic. No fever, night sweats, wt loss, loss of appetite, or other constitutional symptoms.  Pt states good ability with ADL's, has low fall risk, home safety reviewed and adequate, no other significant changes in hearing or vision, and only occasionally active with exercise.  Has gained several lbs Wt Readings from Last 3 Encounters:  10/16/15 197 lb 8 oz (89.585 kg)  11/08/14 194 lb 4 oz (88.111 kg)  07/31/14 192 lb 4 oz (87.204 kg)   Past Medical History  Diagnosis Date  . ALLERGIC RHINITIS   . ASTHMA   . HYPERLIPIDEMIA   . OSTEOPENIA   . STRESS FRACTURE, FOOT   . Splenic lesion    Past Surgical History  Procedure Laterality Date  . Skin graft right forehead    . Breast surgery      reports that she has quit smoking. She does not have any smokeless tobacco history on file. She reports that she drinks alcohol. She reports that she does not use illicit drugs. family history includes Coronary artery disease in her other; Diabetes in her other; Heart disease in her mother; Hyperlipidemia in her mother; Hypertension in her mother and other; Stroke in her other. Allergies  Allergen Reactions  . Clarithromycin     REACTION: nausea  . Codeine   . Erythromycin   . Moxifloxacin     REACTION: nausea   Current Outpatient Prescriptions on File Prior to Visit  Medication Sig Dispense Refill    . albuterol (PROVENTIL HFA;VENTOLIN HFA) 108 (90 BASE) MCG/ACT inhaler Inhale 2 puffs into the lungs every 6 (six) hours as needed for wheezing or shortness of breath (or cough). 1 Inhaler 1  . Calcium Carbonate-Vitamin D (CALCIUM-VITAMIN D) 500-200 MG-UNIT per tablet Take 1 tablet by mouth 2 (two) times daily with a meal.      . Cholecalciferol (VITAMIN D3) 1000 UNITS CAPS Take 1 capsule by mouth daily.      . Cyanocobalamin (VITAMIN B 12 PO) Take 500 mg by mouth daily.      . fexofenadine (ALLEGRA) 180 MG tablet Take 180 mg by mouth daily.      . Fluticasone-Salmeterol (ADVAIR DISKUS) 250-50 MCG/DOSE AEPB Inhale 1 puff into the lungs 2 (two) times daily. 3 each 3  . Multiple Vitamin (MULTIVITAMIN) tablet Take 1 tablet by mouth daily.      . norgestimate-ethinyl estradiol (ORTHO-CYCLEN) 0.25-35 MG-MCG per tablet Take 1 tablet by mouth daily.      Marland Kitchen triamcinolone cream (KENALOG) 0.1 % Apply 1 application topically 2 (two) times daily. 30 g 0   No current facility-administered medications on file prior to visit.    Review of Systems Constitutional: Negative for increased diaphoresis, other activity, appetite or siginficant weight change other than noted HENT: Negative for worsening hearing loss, ear pain, facial swelling, mouth sores and neck stiffness.   Eyes: Negative for other worsening pain, redness or visual disturbance.  Respiratory: Negative for shortness of breath and wheezing  Cardiovascular: Negative for chest pain and palpitations.  Gastrointestinal: Negative for diarrhea, blood in stool, abdominal distention or other pain Genitourinary: Negative for hematuria, flank pain or change in urine volume.  Musculoskeletal: Negative for myalgias or other joint complaints.  Skin: Negative for color change and wound or drainage.  Neurological: Negative for syncope and numbness. other than noted Hematological: Negative for adenopathy. or other swelling Psychiatric/Behavioral: Negative for  hallucinations, SI, self-injury, decreased concentration or other worsening agitation.      Objective:   Physical Exam BP 128/80 mmHg  Pulse 73  Temp(Src) 98.4 F (36.9 C) (Oral)  Resp 20  Ht 5' 6.25" (1.683 m)  Wt 197 lb 8 oz (89.585 kg)  BMI 31.63 kg/m2  SpO2 98% VS noted,  Constitutional: Pt is oriented to person, place, and time. Appears well-developed and well-nourished, in no significant distress Head: Normocephalic and atraumatic.  Right Ear: External ear normal.  Left Ear: External ear normal.  Nose: Nose normal.  Mouth/Throat: Oropharynx is clear and moist.  Eyes: Conjunctivae and EOM are normal. Pupils are equal, round, and reactive to light.  Neck: Normal range of motion. Neck supple. No JVD present. No tracheal deviation present or significant neck LA or mass Cardiovascular: Normal rate, regular rhythm, normal heart sounds and intact distal pulses.   Pulmonary/Chest: Effort normal and breath sounds without rales or wheezing  Abdominal: Soft. Bowel sounds are normal. NT. No HSM  Musculoskeletal: Normal range of motion. Exhibits no edema.  Lymphadenopathy:  Has no cervical adenopathy.  Neurological: Pt is alert and oriented to person, place, and time. Pt has normal reflexes. No cranial nerve deficit. Motor grossly intact Skin: Skin is warm and dry. No rash noted.  Psychiatric:  Has normal mood and affect. Behavior is normal.     Assessment & Plan:

## 2015-10-16 NOTE — Progress Notes (Signed)
Pre visit review using our clinic review tool, if applicable. No additional management support is needed unless otherwise documented below in the visit note. 

## 2015-10-16 NOTE — Patient Instructions (Addendum)
Please fax your most recent lab testing results to [phone] (attn Dr [name])  Please continue all other medications as before, and refills have been done if requested.  Please have the pharmacy call with any other refills you may need.  Please continue your efforts at being more active, low cholesterol diet, and weight control.  You are otherwise up to date with prevention measures today.  Please keep your appointments with your specialists as you may have planned  Please return in 1 year for your yearly visit, or sooner if needed, with Lab testing done 3-5 days before

## 2015-10-16 NOTE — Assessment & Plan Note (Signed)

## 2016-10-26 ENCOUNTER — Ambulatory Visit (INDEPENDENT_AMBULATORY_CARE_PROVIDER_SITE_OTHER): Payer: Managed Care, Other (non HMO) | Admitting: Internal Medicine

## 2016-10-26 ENCOUNTER — Encounter: Payer: Self-pay | Admitting: Internal Medicine

## 2016-10-26 VITALS — BP 122/76 | HR 68 | Temp 98.0°F | Resp 20 | Wt 156.0 lb

## 2016-10-26 DIAGNOSIS — Z0000 Encounter for general adult medical examination without abnormal findings: Secondary | ICD-10-CM | POA: Diagnosis not present

## 2016-10-26 NOTE — Progress Notes (Signed)
Subjective:    Patient ID: Andrea Bonilla, female    DOB: [birth year], 45 y.o.   MRN: [id number]  HPI  Here for wellness and f/u;  Overall doing ok;  Pt denies Chest pain, worsening SOB, DOE, wheezing, orthopnea, PND, worsening LE edema, palpitations, dizziness or syncope.  Pt denies neurological change such as new headache, facial or extremity weakness.  Pt denies polydipsia, polyuria, or low sugar symptoms. Pt states overall good compliance with treatment and medications, good tolerability, and has been trying to follow appropriate diet.  Pt denies worsening depressive symptoms, suicidal ideation or panic. No fever, night sweats, wt loss, loss of appetite, or other constitutional symptoms.  Pt states good ability with ADL's, has low fall risk, home safety reviewed and adequate, no other significant changes in hearing or vision, has been more active exercise and very diligent with diet, son has lost over 100 lbs in 2 yrs before her effort:  Has really been working on diet and exercise since feb 2017 Wt Readings from Last 3 Encounters:  10/26/16 156 lb (70.8 kg)  10/16/15 197 lb 8 oz (89.6 kg)  11/08/14 194 lb 4 oz (88.1 kg)  Has seen dermatology - no problems  Past Medical History:  Diagnosis Date  . ALLERGIC RHINITIS   . ASTHMA   . HYPERLIPIDEMIA   . OSTEOPENIA   . Splenic lesion   . STRESS FRACTURE, FOOT    Past Surgical History:  Procedure Laterality Date  . BREAST SURGERY    . skin graft Right forehead      reports that she has quit smoking. She does not have any smokeless tobacco history on file. She reports that she drinks alcohol. She reports that she does not use drugs. family history includes Coronary artery disease in her other; Diabetes in her other; Heart disease in her mother; Hyperlipidemia in her mother; Hypertension in her mother and other; Stroke in her other. Allergies  Allergen Reactions  . Clarithromycin     REACTION: nausea  . Codeine   . Erythromycin   .  Moxifloxacin     REACTION: nausea   Current Outpatient Prescriptions on File Prior to Visit  Medication Sig Dispense Refill  . albuterol (PROVENTIL HFA;VENTOLIN HFA) 108 (90 BASE) MCG/ACT inhaler Inhale 2 puffs into the lungs every 6 (six) hours as needed for wheezing or shortness of breath (or cough). 1 Inhaler 1  . Calcium Carbonate-Vitamin D (CALCIUM-VITAMIN D) 500-200 MG-UNIT per tablet Take 1 tablet by mouth 2 (two) times daily with a meal.      . Cholecalciferol (VITAMIN D3) 1000 UNITS CAPS Take 1 capsule by mouth daily.      . Cyanocobalamin (VITAMIN B 12 PO) Take 500 mg by mouth daily.      . fexofenadine (ALLEGRA) 180 MG tablet Take 180 mg by mouth daily.      . Fluticasone-Salmeterol (ADVAIR DISKUS) 250-50 MCG/DOSE AEPB Inhale 1 puff into the lungs 2 (two) times daily. 3 each 3  . Multiple Vitamin (MULTIVITAMIN) tablet Take 1 tablet by mouth daily.      . norgestimate-ethinyl estradiol (ORTHO-CYCLEN) 0.25-35 MG-MCG per tablet Take 1 tablet by mouth daily.      Marland Kitchen triamcinolone cream (KENALOG) 0.1 % Apply 1 application topically 2 (two) times daily. 30 g 0   No current facility-administered medications on file prior to visit.     Review of Systems Constitutional: Negative for increased diaphoresis, or other activity, appetite or siginficant weight change other than noted HENT:  Negative for worsening hearing loss, ear pain, facial swelling, mouth sores and neck stiffness.   Eyes: Negative for other worsening pain, redness or visual disturbance.  Respiratory: Negative for choking or stridor Cardiovascular: Negative for other chest pain and palpitations.  Gastrointestinal: Negative for worsening diarrhea, blood in stool, or abdominal distention Genitourinary: Negative for hematuria, flank pain or change in urine volume.  Musculoskeletal: Negative for myalgias or other joint complaints.  Skin: Negative for other color change and wound or drainage.  Neurological: Negative for syncope  and numbness. other than noted Hematological: Negative for adenopathy. or other swelling Psychiatric/Behavioral: Negative for hallucinations, SI, self-injury, decreased concentration or other worsening agitation.  All other system neg per pt     Objective:   Physical Exam BP 122/76   Pulse 68   Temp 98 F (36.7 C) (Oral)   Resp 20   Wt 156 lb (70.8 kg)   SpO2 97%   BMI 24.99 kg/m  VS noted, has visibly lost wt Constitutional: Pt is oriented to person, place, and time. Appears well-developed and well-nourished, in no significant distress Head: Normocephalic and atraumatic  Eyes: Conjunctivae and EOM are normal. Pupils are equal, round, and reactive to light Right Ear: External ear normal.  Left Ear: External ear normal Nose: Nose normal.  Mouth/Throat: Oropharynx is clear and moist  Neck: Normal range of motion. Neck supple. No JVD present. No tracheal deviation present or significant neck LA or mass Cardiovascular: Normal rate, regular rhythm, normal heart sounds and intact distal pulses.   Pulmonary/Chest: Effort normal and breath sounds without rales or wheezing  Abdominal: Soft. Bowel sounds are normal. NT. No HSM  Musculoskeletal: Normal range of motion. Exhibits no edema Lymphadenopathy: Has no cervical adenopathy.  Neurological: Pt is alert and oriented to person, place, and time. Pt has normal reflexes. No cranial nerve deficit. Motor grossly intact Skin: Skin is warm and dry. No rash noted or new ulcers Psychiatric:  Has normal mood and affect. Behavior is normal.  No other new exam findings    Assessment & Plan:

## 2016-10-26 NOTE — Progress Notes (Signed)
Pre visit review using our clinic review tool, if applicable. No additional management support is needed unless otherwise documented below in the visit note. 

## 2016-10-26 NOTE — Assessment & Plan Note (Signed)

## 2016-10-26 NOTE — Patient Instructions (Signed)
Please continue all other medications as before, and refills have been done if requested.  Please have the pharmacy call with any other refills you may need.  Please continue your efforts at being more active, low cholesterol diet, and weight control.  You are otherwise up to date with prevention measures today.  Please keep your appointments with your specialists as you may have planned  Please return in 1 year for your yearly visit, or sooner if needed, with Lab testing done 3-5 days before  

## 2017-11-01 ENCOUNTER — Encounter: Payer: Self-pay | Admitting: Internal Medicine

## 2017-11-01 ENCOUNTER — Ambulatory Visit (INDEPENDENT_AMBULATORY_CARE_PROVIDER_SITE_OTHER): Payer: Managed Care, Other (non HMO) | Admitting: Internal Medicine

## 2017-11-01 VITALS — BP 120/76 | HR 61 | Temp 98.3°F | Ht 66.25 in | Wt 162.0 lb

## 2017-11-01 DIAGNOSIS — Z0000 Encounter for general adult medical examination without abnormal findings: Secondary | ICD-10-CM

## 2017-11-01 DIAGNOSIS — Z114 Encounter for screening for human immunodeficiency virus [HIV]: Secondary | ICD-10-CM

## 2017-11-01 NOTE — Patient Instructions (Signed)
Please continue all other medications as before, and refills have been done if requested.  Please have the pharmacy call with any other refills you may need.  Please continue your efforts at being more active, low cholesterol diet, and weight control.  You are otherwise up to date with prevention measures today.  Please keep your appointments with your specialists as you may have planned  Please return in 1 year for your yearly visit, or sooner if needed, with Lab testing done 3-5 days before  

## 2017-11-01 NOTE — Progress Notes (Signed)
Subjective:    Patient ID: Andrea Bonilla, female    DOB: [birth year], 46 y.o.   MRN: [id number]  HPI  Here for wellness and f/u;  Overall doing ok;  Pt denies Chest pain, worsening SOB, DOE, wheezing, orthopnea, PND, worsening LE edema, palpitations, dizziness or syncope.  Pt denies neurological change such as new headache, facial or extremity weakness.  Pt denies polydipsia, polyuria, or low sugar symptoms. Pt states overall good compliance with treatment and medications, good tolerability, and has been trying to follow appropriate diet.  Pt denies worsening depressive symptoms, suicidal ideation or panic. No fever, night sweats, wt loss, loss of appetite, or other constitutional symptoms.  Pt states good ability with ADL's, has low fall risk, home safety reviewed and adequate, no other significant changes in hearing or vision, and only occasionally active with exercise. Has been able to control wt with diet, though up a few lbs from last yr Wt Readings from Last 3 Encounters:  11/01/17 162 lb (73.5 kg)  10/26/16 156 lb (70.8 kg)  10/16/15 197 lb 8 oz (89.6 kg)   Brings printed labs from recent draw per GYN Past Medical History:  Diagnosis Date  . ALLERGIC RHINITIS   . ASTHMA   . HYPERLIPIDEMIA   . OSTEOPENIA   . Splenic lesion   . STRESS FRACTURE, FOOT    Past Surgical History:  Procedure Laterality Date  . BREAST SURGERY    . skin graft Right forehead      reports that she has quit smoking. she has never used smokeless tobacco. She reports that she drinks alcohol. She reports that she does not use drugs. family history includes Coronary artery disease in her other; Diabetes in her other; Heart disease in her mother; Hyperlipidemia in her mother; Hypertension in her mother and other; Stroke in her other. Allergies  Allergen Reactions  . Clarithromycin     REACTION: nausea  . Codeine   . Erythromycin   . Moxifloxacin     REACTION: nausea   Current Outpatient Medications on File  Prior to Visit  Medication Sig Dispense Refill  . albuterol (PROVENTIL HFA;VENTOLIN HFA) 108 (90 BASE) MCG/ACT inhaler Inhale 2 puffs into the lungs every 6 (six) hours as needed for wheezing or shortness of breath (or cough). 1 Inhaler 1  . Calcium Carbonate-Vitamin D (CALCIUM-VITAMIN D) 500-200 MG-UNIT per tablet Take 1 tablet by mouth 2 (two) times daily with a meal.      . Cholecalciferol (VITAMIN D3) 1000 UNITS CAPS Take 1 capsule by mouth daily.      . Cyanocobalamin (VITAMIN B 12 PO) Take 500 mg by mouth daily.      . fexofenadine (ALLEGRA) 180 MG tablet Take 180 mg by mouth daily.      . Fluticasone-Salmeterol (ADVAIR DISKUS) 250-50 MCG/DOSE AEPB Inhale 1 puff into the lungs 2 (two) times daily. 3 each 3  . Multiple Vitamin (MULTIVITAMIN) tablet Take 1 tablet by mouth daily.      . norgestimate-ethinyl estradiol (ORTHO-CYCLEN) 0.25-35 MG-MCG per tablet Take 1 tablet by mouth daily.      Marland Kitchen triamcinolone cream (KENALOG) 0.1 % Apply 1 application topically 2 (two) times daily. 30 g 0   No current facility-administered medications on file prior to visit.    Review of Systems Constitutional: Negative for other unusual diaphoresis, sweats, appetite or weight changes HENT: Negative for other worsening hearing loss, ear pain, facial swelling, mouth sores or neck stiffness.   Eyes: Negative for other worsening  pain, redness or other visual disturbance.  Respiratory: Negative for other stridor or swelling Cardiovascular: Negative for other palpitations or other chest pain  Gastrointestinal: Negative for worsening diarrhea or loose stools, blood in stool, distention or other pain Genitourinary: Negative for hematuria, flank pain or other change in urine volume.  Musculoskeletal: Negative for myalgias or other joint swelling.  Skin: Negative for other color change, or other wound or worsening drainage.  Neurological: Negative for other syncope or numbness. Hematological: Negative for other  adenopathy or swelling Psychiatric/Behavioral: Negative for hallucinations, other worsening agitation, SI, self-injury, or new decreased concentration All other system neg per pt    Objective:   Physical Exam BP 120/76   Pulse 61   Temp 98.3 F (36.8 C) (Oral)   Ht 5' 6.25" (1.683 m)   Wt 162 lb (73.5 kg)   SpO2 100%   BMI 25.95 kg/m  VS noted,  Constitutional: Pt is oriented to person, place, and time. Appears well-developed and well-nourished, in no significant distress and comfortable Head: Normocephalic and atraumatic  Eyes: Conjunctivae and EOM are normal. Pupils are equal, round, and reactive to light Right Ear: External ear normal without discharge Left Ear: External ear normal without discharge Nose: Nose without discharge or deformity Mouth/Throat: Oropharynx is without other ulcerations and moist  Neck: Normal range of motion. Neck supple. No JVD present. No tracheal deviation present or significant neck LA or mass Cardiovascular: Normal rate, regular rhythm, normal heart sounds and intact distal pulses.   Pulmonary/Chest: WOB normal and breath sounds without rales or wheezing  Abdominal: Soft. Bowel sounds are normal. NT. No HSM  Musculoskeletal: Normal range of motion. Exhibits no edema Lymphadenopathy: Has no other cervical adenopathy.  Neurological: Pt is alert and oriented to person, place, and time. Pt has normal reflexes. No cranial nerve deficit. Motor grossly intact, Gait intact Skin: Skin is warm and dry. No rash noted or new ulcerations Psychiatric:  Has normal mood and affect. Behavior is normal without agitation No other exam findings     Assessment & Plan:

## 2017-11-01 NOTE — Assessment & Plan Note (Signed)

## 2018-11-06 ENCOUNTER — Ambulatory Visit (INDEPENDENT_AMBULATORY_CARE_PROVIDER_SITE_OTHER): Payer: Managed Care, Other (non HMO) | Admitting: Internal Medicine

## 2018-11-06 ENCOUNTER — Encounter: Payer: Self-pay | Admitting: Internal Medicine

## 2018-11-06 VITALS — BP 114/78 | HR 92 | Temp 98.0°F | Ht 66.25 in | Wt 167.0 lb

## 2018-11-06 DIAGNOSIS — Z0001 Encounter for general adult medical examination with abnormal findings: Secondary | ICD-10-CM | POA: Diagnosis not present

## 2018-11-06 DIAGNOSIS — J309 Allergic rhinitis, unspecified: Secondary | ICD-10-CM

## 2018-11-06 DIAGNOSIS — R1310 Dysphagia, unspecified: Secondary | ICD-10-CM | POA: Diagnosis not present

## 2018-11-06 DIAGNOSIS — Z114 Encounter for screening for human immunodeficiency virus [HIV]: Secondary | ICD-10-CM | POA: Diagnosis not present

## 2018-11-06 DIAGNOSIS — E785 Hyperlipidemia, unspecified: Secondary | ICD-10-CM | POA: Diagnosis not present

## 2018-11-06 MED ORDER — TRIAMCINOLONE ACETONIDE 55 MCG/ACT NA AERO: 2.0000 | INHALATION_SPRAY | Freq: Every day | NASAL | 12 refills | Status: DC

## 2018-11-06 MED ORDER — PANTOPRAZOLE SODIUM 40 MG PO TBEC: 40.0000 mg | DELAYED_RELEASE_TABLET | Freq: Every day | ORAL | 1 refills | Status: DC

## 2018-11-06 NOTE — Assessment & Plan Note (Signed)
Mild to mod, for nasacort asd,  to f/u any worsening symptoms or concerns,  to f/u any worsening symptoms or concerns

## 2018-11-06 NOTE — Assessment & Plan Note (Signed)
Worsening, declines statin, for lower chol diet

## 2018-11-06 NOTE — Progress Notes (Signed)
Subjective:    Patient ID: Andrea Bonilla, female    DOB: 10/18/1971, 47 y.o.   MRN: [id number]  HPI    Here for wellness and f/u;  Overall doing ok;  Pt denies Chest pain, worsening SOB, DOE, wheezing, orthopnea, PND, worsening LE edema, palpitations, dizziness or syncope.  Pt denies neurological change such as new headache, facial or extremity weakness.  Pt denies polydipsia, polyuria, or low sugar symptoms. Pt states overall good compliance with treatment and medications, good tolerability, and has been trying to follow appropriate diet.  Pt denies worsening depressive symptoms, suicidal ideation or panic. No fever, night sweats, wt loss, loss of appetite, or other constitutional symptoms.  Pt states good ability with ADL's, has low fall risk, home safety reviewed and adequate, no other significant changes in hearing or vision, and only occasionally active with exercise, less than last yr and Wt up overall with less attention to diet and activity due to time demands with caring for mother.   Wt Readings from Last 3 Encounters:  11/06/18 167 lb (75.8 kg)  11/01/17 162 lb (73.5 kg)  10/26/16 156 lb (70.8 kg)  Has [facility] Oct 19, 2018 results - total t4 mild increased, lipids uncontrolled with LDL 166, HDL 72 , TG 106, total chol 254; with cbc/ cmet o/w normal.  Also does have several wks ongoing nasal allergy symptoms with clearish congestion, itch and sneezing, without fever, pain, ST, cough, swelling or wheezing. Also c/o mild dysphagia new onset x 1 mo intermittent to solids, no vomiting but just occasional has difficult time passing bread.  Has had mild worsening reflux, but no abd pain, dysphagia, n/v, bowel change or blood. Past Medical History:  Diagnosis Date  . ALLERGIC RHINITIS   . ASTHMA   . HYPERLIPIDEMIA   . OSTEOPENIA   . Splenic lesion   . STRESS FRACTURE, FOOT    Past Surgical History:  Procedure Laterality Date  . BREAST SURGERY    . skin graft Right forehead       reports that she has quit smoking. She has never used smokeless tobacco. She reports that she drinks alcohol. She reports that she does not use drugs. family history includes Coronary artery disease in her other; Diabetes in her other; Heart disease in her mother; Hyperlipidemia in her mother; Hypertension in her mother and other; Stroke in her other. Allergies  Allergen Reactions  . Clarithromycin     REACTION: nausea  . Codeine   . Erythromycin   . Moxifloxacin     REACTION: nausea   Current Outpatient Medications on File Prior to Visit  Medication Sig Dispense Refill  . albuterol (PROVENTIL HFA;VENTOLIN HFA) 108 (90 BASE) MCG/ACT inhaler Inhale 2 puffs into the lungs every 6 (six) hours as needed for wheezing or shortness of breath (or cough). 1 Inhaler 1  . Calcium Carbonate-Vitamin D (CALCIUM-VITAMIN D) 500-200 MG-UNIT per tablet Take 1 tablet by mouth 2 (two) times daily with a meal.      . Cholecalciferol (VITAMIN D3) 1000 UNITS CAPS Take 1 capsule by mouth daily.      . Cyanocobalamin (VITAMIN B 12 PO) Take 500 mg by mouth daily.      . fexofenadine (ALLEGRA) 180 MG tablet Take 180 mg by mouth daily.      . Fluticasone-Salmeterol (ADVAIR DISKUS) 250-50 MCG/DOSE AEPB Inhale 1 puff into the lungs 2 (two) times daily. 3 each 3  . Multiple Vitamin (MULTIVITAMIN) tablet Take 1 tablet by  mouth daily.      . norgestimate-ethinyl estradiol (ORTHO-CYCLEN) 0.25-35 MG-MCG per tablet Take 1 tablet by mouth daily.      Marland Kitchen triamcinolone cream (KENALOG) 0.1 % Apply 1 application topically 2 (two) times daily. 30 g 0   No current facility-administered medications on file prior to visit.    Review of Systems Constitutional: Negative for other unusual diaphoresis, sweats, appetite or weight changes HENT: Negative for other worsening hearing loss, ear pain, facial swelling, mouth sores or neck stiffness.   Eyes: Negative for other worsening pain, redness or other visual disturbance.    Respiratory: Negative for other stridor or swelling Cardiovascular: Negative for other palpitations or other chest pain  Gastrointestinal: Negative for worsening diarrhea or loose stools, blood in stool, distention or other pain Genitourinary: Negative for hematuria, flank pain or other change in urine volume.  Musculoskeletal: Negative for myalgias or other joint swelling.  Skin: Negative for other color change, or other wound or worsening drainage.  Neurological: Negative for other syncope or numbness. Hematological: Negative for other adenopathy or swelling Psychiatric/Behavioral: Negative for hallucinations, other worsening agitation, SI, self-injury, or new decreased concentration All other system neg per pt    Objective:   Physical Exam BP 114/78   Pulse 92   Temp 98 F (36.7 C) (Oral)   Ht 5' 6.25" (1.683 m)   Wt 167 lb (75.8 kg)   SpO2 97%   BMI 26.75 kg/m  VS noted,  Constitutional: Pt is oriented to person, place, and time. Appears well-developed and well-nourished, in no significant distress and comfortable Head: Normocephalic and atraumatic  Eyes: Conjunctivae and EOM are normal. Pupils are equal, round, and reactive to light Right Ear: External ear normal without discharge Left Ear: External ear normal without discharge Nose: Nose without discharge or deformity Mouth/Throat: Oropharynx is without other ulcerations and moist  Neck: Normal range of motion. Neck supple. No JVD present. No tracheal deviation present or significant neck LA or mass Cardiovascular: Normal rate, regular rhythm, normal heart sounds and intact distal pulses.   Pulmonary/Chest: WOB normal and breath sounds without rales or wheezing  Abdominal: Soft. Bowel sounds are normal. NT. No HSM  Musculoskeletal: Normal range of motion. Exhibits no edema Lymphadenopathy: Has no other cervical adenopathy.  Neurological: Pt is alert and oriented to person, place, and time. Pt has normal reflexes. No  cranial nerve deficit. Motor grossly intact, Gait intact Skin: Skin is warm and dry. No rash noted or new ulcerations Psychiatric:  Has normal mood and affect. Behavior is normal without agitation No other exam findings  Lab Results  Component Value Date   WBC 7.6 11/08/2014   HGB 13.0 11/08/2014   HCT 39.5 11/08/2014   PLT 202.0 11/08/2014   GLUCOSE 74 11/08/2014   CHOL 238 (H) 11/08/2014   TRIG 203.0 (H) 11/08/2014   HDL 65.00 11/08/2014   LDLDIRECT 139.2 11/08/2014   ALT 13 11/08/2014   AST 18 11/08/2014   NA 139 11/08/2014   K 4.2 11/08/2014   CL 104 11/08/2014   CREATININE 0.9 11/08/2014   BUN 12 11/08/2014   CO2 24 11/08/2014   TSH 2.98 11/08/2014   INR 0.89 06/17/2011       Assessment & Plan:

## 2018-11-06 NOTE — Patient Instructions (Signed)
Please take all new medication as prescribed - the nasacort for allergies, and protonix for antacid  Please continue all other medications as before, and refills have been done if requested.  Please have the pharmacy call with any other refills you may need.  Please continue your efforts at being more active, low cholesterol diet, and weight control.  You are otherwise up to date with prevention measures today.  Please keep your appointments with your specialists as you may have planned  You will be contacted regarding the referral for: Gastroenterology  You are given the order for addon HIV testing to be done at [facility]  Please return in 1 year for your yearly visit, or sooner if needed, with Lab testing done 3-5 days before

## 2018-11-06 NOTE — Assessment & Plan Note (Addendum)
New onset, cant r/o malignancy, for GI referral, for protonix 40 qd, may also need colonoscopy as several in her immediate family have had polyps but she is not sure were adenomas  In addition to the time spent performing CPE, I spent an additional 15 minutes face to face,in which greater than 50% of this time was spent in counseling and coordination of care for patient's illness as documented, including the differential dx, treatment, further evaluation and other management of dysphagia, allergies and HLD

## 2018-11-06 NOTE — Assessment & Plan Note (Signed)

## 2019-01-01 ENCOUNTER — Encounter: Payer: Self-pay | Admitting: Internal Medicine

## 2019-04-20 DIAGNOSIS — E785 Hyperlipidemia, unspecified: Secondary | ICD-10-CM | POA: Diagnosis not present

## 2019-06-06 DIAGNOSIS — Z1231 Encounter for screening mammogram for malignant neoplasm of breast: Secondary | ICD-10-CM | POA: Diagnosis not present

## 2019-07-06 DIAGNOSIS — R109 Unspecified abdominal pain: Secondary | ICD-10-CM | POA: Diagnosis not present

## 2019-07-17 ENCOUNTER — Encounter: Payer: Self-pay | Admitting: Emergency Medicine

## 2019-07-17 ENCOUNTER — Emergency Department: Payer: BC Managed Care – PPO

## 2019-07-17 ENCOUNTER — Emergency Department
Admission: EM | Admit: 2019-07-17 | Discharge: 2019-07-17 | Disposition: A | Discharge status: Home / Self Care | Payer: BC Managed Care – PPO | Attending: Student in an Organized Health Care Education/Training Program | Admitting: Student in an Organized Health Care Education/Training Program

## 2019-07-17 ENCOUNTER — Other Ambulatory Visit: Payer: Self-pay

## 2019-07-17 DIAGNOSIS — Z87891 Personal history of nicotine dependence: Secondary | ICD-10-CM | POA: Insufficient documentation

## 2019-07-17 DIAGNOSIS — R1084 Generalized abdominal pain: Secondary | ICD-10-CM | POA: Diagnosis not present

## 2019-07-17 DIAGNOSIS — J45909 Unspecified asthma, uncomplicated: Secondary | ICD-10-CM | POA: Insufficient documentation

## 2019-07-17 DIAGNOSIS — Z79899 Other long term (current) drug therapy: Secondary | ICD-10-CM | POA: Diagnosis not present

## 2019-07-17 DIAGNOSIS — R531 Weakness: Secondary | ICD-10-CM | POA: Diagnosis not present

## 2019-07-17 DIAGNOSIS — R109 Unspecified abdominal pain: Secondary | ICD-10-CM | POA: Diagnosis not present

## 2019-07-17 LAB — COMPREHENSIVE METABOLIC PANEL
ALT: 21 U/L (ref 0–44)
AST: 21 U/L (ref 15–41)
Albumin: 4 g/dL (ref 3.5–5.0)
Alkaline Phosphatase: 41 U/L (ref 38–126)
Anion gap: 9 (ref 5–15)
BUN: 9 mg/dL (ref 6–20)
CO2: 24 mmol/L (ref 22–32)
Calcium: 8.9 mg/dL (ref 8.9–10.3)
Chloride: 101 mmol/L (ref 98–111)
Creatinine, Ser: 0.89 mg/dL (ref 0.44–1.00)
GFR calc Af Amer: >60 mL/min (ref >60)
GFR calc non Af Amer: >60 mL/min (ref >60)
Glucose, Bld: 110 mg/dL — ABNORMAL HIGH (ref 70–99)
Potassium: 3.7 mmol/L (ref 3.5–5.1)
Sodium: 134 mmol/L — ABNORMAL LOW (ref 135–145)
Total Bilirubin: 0.9 mg/dL (ref 0.3–1.2)
Total Protein: 7.2 g/dL (ref 6.5–8.1)

## 2019-07-17 LAB — URINALYSIS, COMPLETE (UACMP) WITH MICROSCOPIC
Bacteria, UA: NONE SEEN
Bilirubin Urine: NEGATIVE
Glucose, UA: NEGATIVE mg/dL
Ketones, ur: 5 mg/dL — AB
Leukocytes,Ua: NEGATIVE
Nitrite: NEGATIVE
Protein, ur: NEGATIVE mg/dL
Specific Gravity, Urine: 1.009 (ref 1.005–1.030)
pH: 6 (ref 5.0–8.0)

## 2019-07-17 LAB — CBC
HCT: 39.1 % (ref 36.0–46.0)
Hemoglobin: 13.5 g/dL (ref 12.0–15.0)
MCH: 31 pg (ref 26.0–34.0)
MCHC: 34.5 g/dL (ref 30.0–36.0)
MCV: 89.7 fL (ref 80.0–100.0)
Platelets: 239 10*3/uL (ref 150–400)
RBC: 4.36 MIL/uL (ref 3.87–5.11)
RDW: 11.8 % (ref 11.5–15.5)
WBC: 7.7 10*3/uL (ref 4.0–10.5)
nRBC: 0 % (ref 0.0–0.2)

## 2019-07-17 LAB — POCT PREGNANCY, URINE: Preg Test, Ur: NEGATIVE

## 2019-07-17 LAB — LIPASE, BLOOD: Lipase: 29 U/L (ref 11–51)

## 2019-07-17 MED ORDER — DICYCLOMINE HCL 10 MG PO CAPS: 10.0000 mg | ORAL_CAPSULE | Freq: Three times a day (TID) | ORAL | 1 refills | Status: DC | PRN

## 2019-07-17 MED ORDER — IOHEXOL 300 MG/ML  SOLN
75.0000 mL | Freq: Once | INTRAMUSCULAR | Status: AC | PRN
  Administered 2019-07-17: 75 mL via INTRAVENOUS

## 2019-07-17 MED ORDER — SODIUM CHLORIDE 0.9 % IV BOLUS
1000.0000 mL | Freq: Once | INTRAVENOUS | Status: AC
  Administered 2019-07-17: 1000 mL via INTRAVENOUS

## 2019-07-17 MED ORDER — ONDANSETRON HCL 4 MG/2ML IJ SOLN
4.0000 mg | Freq: Once | INTRAMUSCULAR | Status: AC
  Administered 2019-07-17: 4 mg via INTRAVENOUS
  Filled 2019-07-17: qty 2

## 2019-07-17 MED ORDER — ONDANSETRON HCL 4 MG PO TABS: 4.0000 mg | ORAL_TABLET | Freq: Every day | ORAL | 0 refills | Status: AC | PRN

## 2019-07-17 MED ORDER — SODIUM CHLORIDE 0.9% FLUSH: 3.0000 mL | Freq: Once | INTRAVENOUS | Status: DC

## 2019-07-17 NOTE — ED Triage Notes (Signed)
Pt presents to ED via POV with c/o generalized weakness. Pt states hasn't been able to eat or drink since Friday of last week, also c/o abdominal pain. Pt states has been dx with colitis in the past, has not seen GI regarding most recent bout of abdominal pain. Pt states had US done last week to check gallbladder and [place] was negative.

## 2019-07-17 NOTE — ED Provider Notes (Signed)
[facility] Emergency Department Provider Note    First MD Initiated Contact with Patient 07/17/19 1148     (approximate)  I have reviewed the triage vital signs and the nursing notes.   HISTORY  Chief Complaint Weakness    HPI Andrea Bonilla is a 48 y.o. female below listed past medical history presented to ER with several weeks of intermittent episodes of crampy abdominal pain associated with large volume loose stool.  Denies any melena or hematochezia.  No recent antibiotics.  They do have well water at home but no one else is sick.  Denies any history of gluten intolerance or food allergy.  She does have outpatient follow-up with GI but thought the symptoms were becoming more frequent over the past few weeks so she came to the ER for further evaluation.  Had one episode last night.  These seem to happen sometimes even in the middle of night.    Past Medical History:  Diagnosis Date  . ALLERGIC RHINITIS   . ASTHMA   . HYPERLIPIDEMIA   . OSTEOPENIA   . Splenic lesion   . STRESS FRACTURE, FOOT    Family History  Problem Relation Age of Onset  . Coronary artery disease Other        1st degree relative female <50  . Diabetes Other   . Hypertension Other   . Stroke Other   . Hypertension Mother   . Heart disease Mother   . Hyperlipidemia Mother    Past Surgical History:  Procedure Laterality Date  . BREAST SURGERY    . skin graft Right forehead     Patient Active Problem List   Diagnosis Date Noted  . Dysphagia 11/06/2018  . Family history of polyps in the colon 11/08/2014  . Dysuria 11/08/2014  . Urinary tract infection, site not specified 07/31/2014  . Contact dermatitis 07/31/2014  . Cold sore 07/31/2014  . Encounter for well adult exam with abnormal findings 09/13/2012  . Splenic lesion 04/07/2012  . Splenomegaly 05/11/2011  . IBS (irritable bowel syndrome) 04/27/2011  . OSTEOPENIA 03/31/2010  . STRESS FRACTURE, FOOT 03/02/2010  .  HLD (hyperlipidemia) 06/21/2007  . Allergic rhinitis 06/21/2007  . ASTHMA 06/21/2007  . BIRTHMARK 06/21/2007      Prior to Admission medications   Medication Sig Start Date End Date Taking? Authorizing Provider  albuterol (PROVENTIL HFA;VENTOLIN HFA) 108 (90 BASE) MCG/ACT inhaler Inhale 2 puffs into the lungs every 6 (six) hours as needed for wheezing or shortness of breath (or cough). 11/13/13   Rowe Clack, MD  Calcium Carbonate-Vitamin D (CALCIUM-VITAMIN D) 500-200 MG-UNIT per tablet Take 1 tablet by mouth 2 (two) times daily with a meal.      [provider]  Cholecalciferol (VITAMIN D3) 1000 UNITS CAPS Take 1 capsule by mouth daily.      [provider]  Cyanocobalamin (VITAMIN B 12 PO) Take 500 mg by mouth daily.      [provider]  dicyclomine (BENTYL) 10 MG capsule Take 1 capsule (10 mg total) by mouth 3 (three) times daily as needed for up to 14 days for spasms. 07/17/19 07/31/19  Merlyn Lot, MD  fexofenadine (ALLEGRA) 180 MG tablet Take 180 mg by mouth daily.      [provider]  Fluticasone-Salmeterol (ADVAIR DISKUS) 250-50 MCG/DOSE AEPB Inhale 1 puff into the lungs 2 (two) times daily. 10/16/15   Biagio Borg, MD  Multiple Vitamin (MULTIVITAMIN) tablet Take 1 tablet by mouth  daily.      [provider]  norgestimate-ethinyl estradiol (ORTHO-CYCLEN) 0.25-35 MG-MCG per tablet Take 1 tablet by mouth daily.      [provider]  ondansetron (ZOFRAN) 4 MG tablet Take 1 tablet (4 mg total) by mouth daily as needed. 07/17/19 07/16/20  Merlyn Lot, MD  pantoprazole (PROTONIX) 40 MG tablet Take 1 tablet (40 mg total) by mouth daily. 11/06/18 11/06/19  Biagio Borg, MD  triamcinolone (NASACORT) 55 MCG/ACT AERO nasal inhaler Place 2 sprays into the nose daily. 11/06/18   Biagio Borg, MD  triamcinolone cream (KENALOG) 0.1 % Apply 1 application topically 2 (two) times daily. 07/31/14   Biagio Borg, MD    Allergies  Clarithromycin, Codeine, Erythromycin, and Moxifloxacin    Social History Social History   Tobacco Use  . Smoking status: Former Research scientist (life sciences)  . Smokeless tobacco: Never Used  Substance Use Topics  . Alcohol use: Yes  . Drug use: No    Review of Systems Patient denies headaches, rhinorrhea, blurry vision, numbness, shortness of breath, chest pain, edema, cough, abdominal pain, nausea, vomiting, diarrhea, dysuria, fevers, rashes or hallucinations unless otherwise stated above in HPI. ____________________________________________   PHYSICAL EXAM:  VITAL SIGNS: Vitals:   07/17/19 1033  BP: (!) 114/58  Pulse: 78  Resp: 18  Temp: 98.5 F (36.9 C)  SpO2: 98%    Constitutional: Alert and oriented.  Eyes: Conjunctivae are normal.  Head: Atraumatic. Nose: No congestion/rhinnorhea. Mouth/Throat: Mucous membranes are moist.   Neck: No stridor. Painless ROM.  Cardiovascular: Normal rate, regular rhythm. Grossly normal heart sounds.  Good peripheral circulation. Respiratory: Normal respiratory effort.  No retractions. Lungs CTAB. Gastrointestinal: Soft and nontender. No distention. No abdominal bruits. No CVA tenderness. Genitourinary: deferred Musculoskeletal: No lower extremity tenderness nor edema.  No joint effusions. Neurologic:  Normal speech and language. No gross focal neurologic deficits are appreciated. No facial droop Skin:  Skin is warm, dry and intact. No rash noted. Psychiatric: Mood and affect are normal. Speech and behavior are normal.  ____________________________________________   LABS (all labs ordered are listed, but only abnormal results are displayed)  Results for orders placed or performed during the hospital encounter of 07/17/19 (from the past 24 hour(s))  Lipase, blood     Status: None   Collection Time: 07/17/19 10:42 AM  Result Value Ref Range   Lipase 29 11 - 51 U/L  Comprehensive metabolic panel     Status: Abnormal   Collection Time: 07/17/19  10:42 AM  Result Value Ref Range   Sodium 134 (L) 135 - 145 mmol/L   Potassium 3.7 3.5 - 5.1 mmol/L   Chloride 101 98 - 111 mmol/L   CO2 24 22 - 32 mmol/L   Glucose, Bld 110 (H) 70 - 99 mg/dL   BUN 9 6 - 20 mg/dL   Creatinine, Ser 0.89 0.44 - 1.00 mg/dL   Calcium 8.9 8.9 - 10.3 mg/dL   Total Protein 7.2 6.5 - 8.1 g/dL   Albumin 4.0 3.5 - 5.0 g/dL   AST 21 15 - 41 U/L   ALT 21 0 - 44 U/L   Alkaline Phosphatase 41 38 - 126 U/L   Total Bilirubin 0.9 0.3 - 1.2 mg/dL   GFR calc non Af Amer >60 >60 mL/min   GFR calc Af Amer >60 >60 mL/min   Anion gap 9 5 - 15  CBC     Status: None   Collection Time: 07/17/19 10:42 AM  Result Value  Ref Range   WBC 7.7 4.0 - 10.5 K/uL   RBC 4.36 3.87 - 5.11 MIL/uL   Hemoglobin 13.5 12.0 - 15.0 g/dL   HCT 39.1 36.0 - 46.0 %   MCV 89.7 80.0 - 100.0 fL   MCH 31.0 26.0 - 34.0 pg   MCHC 34.5 30.0 - 36.0 g/dL   RDW 11.8 11.5 - 15.5 %   Platelets 239 150 - 400 K/uL   nRBC 0.0 0.0 - 0.2 %  Urinalysis, Complete w Microscopic     Status: Abnormal   Collection Time: 07/17/19 10:42 AM  Result Value Ref Range   Color, Urine YELLOW (A) YELLOW   APPearance CLEAR (A) CLEAR   Specific Gravity, Urine 1.009 1.005 - 1.030   pH 6.0 5.0 - 8.0   Glucose, UA NEGATIVE NEGATIVE mg/dL   Hgb urine dipstick MODERATE (A) NEGATIVE   Bilirubin Urine NEGATIVE NEGATIVE   Ketones, ur 5 (A) NEGATIVE mg/dL   Protein, ur NEGATIVE NEGATIVE mg/dL   Nitrite NEGATIVE NEGATIVE   Leukocytes,Ua NEGATIVE NEGATIVE   RBC / HPF 0-5 0 - 5 RBC/hpf   WBC, UA 0-5 0 - 5 WBC/hpf   Bacteria, UA NONE SEEN NONE SEEN   Squamous Epithelial / LPF 0-5 0 - 5   Mucus PRESENT   Pregnancy, urine POC     Status: None   Collection Time: 07/17/19 10:50 AM  Result Value Ref Range   Preg Test, Ur NEGATIVE NEGATIVE   ____________________________________________  EKG My review and personal interpretation at Time:   10:42 Indication: abdominal pain  Rate: 80  Rhythm: sinus Axis: normal Other: normal  intervals, no stemi ____________________________________________  RADIOLOGY  I personally reviewed all radiographic images ordered to evaluate for the above acute complaints and reviewed radiology reports and findings.  These findings were personally discussed with the patient.  Please see medical record for radiology report.  ____________________________________________   PROCEDURES  Procedure(s) performed:  Procedures    Critical Care performed: no ____________________________________________   INITIAL IMPRESSION / ASSESSMENT AND PLAN / ED COURSE  Pertinent labs & imaging results that were available during my care of the patient were reviewed by me and considered in my medical decision making (see chart for details).   DDX: Enteritis, gastritis, food allergy, colitis, IBD, IBS, SBO  Andrea Bonilla is a 48 y.o. who presents to the ED with symptoms as described above.  Patient nontoxic-appearing but is having worsening intermittent abdominal pain associated with diarrheal illness.  Based on her intermittent pain state that she is not been able to keep much down for the past several days we will order blood work as well as IV fluids and CT imaging to exclude obstructive pattern and evaluate for the above differential.  Clinically does not appear toxic or severely dehydrated.  Will provide antiemetic.  Clinical Course as of Jul 16 1314  Tue Jul 17, 2019  1311 Patient reassessed.  Repeat abdominal exam soft benign.  CT imaging is reassuring.  At this point I do believe she is appropriate for outpatient follow-up.  Will give antiemetic as well as Bentyl.  She has a follow-up appoint with GI.  We discussed signs and symptoms which patient should return to the ER.   [PR]    Clinical Course User Index [PR] Merlyn Lot, MD    The patient was evaluated in Emergency Department today for the symptoms described in the history of present illness. He/she was evaluated in the context of  the [place] COVID-19  pandemic, which necessitated consideration that the patient might be at risk for infection with the SARS-CoV-2 virus that causes COVID-19. Institutional protocols and algorithms that pertain to the evaluation of patients at risk for COVID-19 are in a state of rapid change based on information released by regulatory bodies including the CDC and federal and state organizations. These policies and algorithms were followed during the patient's care in the ED.  As part of my medical decision making, I reviewed the following data within the [facility] notes reviewed and incorporated, Labs reviewed, notes from prior ED visits and [place] Controlled Substance Database   ____________________________________________   FINAL CLINICAL IMPRESSION(S) / ED DIAGNOSES  Final diagnoses:  Generalized abdominal pain      NEW MEDICATIONS STARTED DURING THIS VISIT:  New Prescriptions   DICYCLOMINE (BENTYL) 10 MG CAPSULE    Take 1 capsule (10 mg total) by mouth 3 (three) times daily as needed for up to 14 days for spasms.   ONDANSETRON (ZOFRAN) 4 MG TABLET    Take 1 tablet (4 mg total) by mouth daily as needed.     Note:  This document was prepared using Dragon voice recognition software and may include unintentional dictation errors.    Merlyn Lot, MD 07/17/19 1315

## 2019-07-17 NOTE — Discharge Instructions (Signed)

## 2019-07-27 ENCOUNTER — Encounter

## 2019-07-27 ENCOUNTER — Encounter: Payer: Self-pay | Admitting: Gastroenterology

## 2019-07-27 ENCOUNTER — Ambulatory Visit (INDEPENDENT_AMBULATORY_CARE_PROVIDER_SITE_OTHER): Payer: BC Managed Care – PPO | Admitting: Gastroenterology

## 2019-07-27 VITALS — BP 100/62 | HR 59 | Temp 98.1°F | Ht 66.25 in | Wt 160.0 lb

## 2019-07-27 DIAGNOSIS — R1030 Lower abdominal pain, unspecified: Secondary | ICD-10-CM | POA: Diagnosis not present

## 2019-07-27 DIAGNOSIS — K529 Noninfective gastroenteritis and colitis, unspecified: Secondary | ICD-10-CM

## 2019-07-27 MED ORDER — NA SULFATE-K SULFATE-MG SULF 17.5-3.13-1.6 GM/177ML PO SOLN: 1.0000 | Freq: Once | ORAL | 0 refills | Status: AC

## 2019-07-27 MED ORDER — DICYCLOMINE HCL 10 MG PO CAPS: 10.0000 mg | ORAL_CAPSULE | Freq: Two times a day (BID) | ORAL | 0 refills | Status: DC

## 2019-07-27 NOTE — Patient Instructions (Addendum)
If you are age 49 or older, your body mass index should be between 23-30. Your Body mass index is 25.63 kg/m. If this is out of the aforementioned range listed, please consider follow up with your Primary Care Provider.  If you are age 27 or younger, your body mass index should be between 19-25. Your Body mass index is 25.63 kg/m. If this is out of the aformentioned range listed, please consider follow up with your Primary Care Provider.   You have been scheduled for a colonoscopy. Please follow written instructions given to you at your visit today.  Please pick up your prep supplies at the pharmacy within the next 1-3 days. If you use inhalers (even only as needed), please bring them with you on the day of your procedure. Your physician has requested that you go to www.startemmi.com and enter the access code given to you at your visit today. This web site gives a general overview about your procedure. However, you should still follow specific instructions given to you by our office regarding your preparation for the procedure.  It was a pleasure to see you today!  Dr. Loletha Carrow

## 2019-07-27 NOTE — Progress Notes (Signed)
[facility] Consult Note:  History: Andrea Bonilla 07/27/2019  Referring provider: Biagio Borg, MD  Reason for consult/chief complaint: dsyphagia (occasional since Fall or 2019; typically to solids but occasionally to softer foods), Abdominal Pain (has been occuring x 1 year but worsening x 4-6 weeks; lower abdominal cramping pain; wakes her up at night; heart races and feels urgent need to have bm; soft, loose bm's; no blood in stool; was given abx recently which helped some; was also given antispasmotic at ER which seems to have helped), and Nausea (some nausea but no vomiting)   Subjective  HPI:  This is a very pleasant 48 year old woman referred by primary care after a recent ED visit for lower abdominal pain and diarrhea.  For about the last year she has had episodes where she is awoken at night with crampy lower abdominal pain urgency and loose nonbloody stool.  She will typically feel better after having bowel movements, but sometimes feels "washed out" for a day or 2 afterwards.  The episodes are accompanied by tachycardia and sometimes feeling of anxiety.  There is no associated nausea or vomiting, rash hives or lip swelling. Symptoms have worsened in the last several weeks, and this prompted a trip to the ED as noted below.  Dicyclomine was prescribed 3 times daily, and she has not had an episode since then but it has seem to cause constipation with a BM about every other day.  She had some antibiotics for dental abscess recently, but the symptoms had escalated prior to that.  She has no travel or known GI infectious contacts.  ROS:  Review of Systems  Constitutional: Negative for appetite change and unexpected weight change.  HENT: Negative for mouth sores and voice change.   Eyes: Negative for pain and redness.  Respiratory: Negative for cough and shortness of breath.   Cardiovascular: Negative for chest pain and palpitations.  Genitourinary: Negative  for dysuria and hematuria.  Musculoskeletal: Negative for arthralgias and myalgias.  Skin: Negative for pallor and rash.  Neurological: Negative for weakness and headaches.  Hematological: Negative for adenopathy.   For about a year she has had rare episodes where food will feel "slow" passing through lower esophagus, typically when she eats quickly or large bite.  She denies odynophagia, nausea vomiting or weight loss.  Past Medical History: Past Medical History:  Diagnosis Date  . ALLERGIC RHINITIS   . ASTHMA   . HYPERLIPIDEMIA   . IBS (irritable bowel syndrome)   . Kidney stones   . OSTEOPENIA   . Splenic lesion   . STRESS FRACTURE, FOOT      Past Surgical History: Past Surgical History:  Procedure Laterality Date  . BREAST SURGERY    . skin graft Right forehead       Family History: Family History  Problem Relation Age of Onset  . Coronary artery disease Other        1st degree relative female <50  . Diabetes Other   . Hypertension Other   . Stroke Other   . Hypertension Mother   . Heart disease Mother   . Hyperlipidemia Mother   . Colon polyps Mother   . Colon polyps Father   . Colon polyps Brother   . Esophageal cancer Neg Hx   . Stomach cancer Neg Hx   . Liver disease Neg Hx   . Pancreatic cancer Neg Hx     Social History: Social History   Socioeconomic History  . Marital  status: Married    Spouse name: Not on file  . Number of children: Not on file  . Years of education: Not on file  . Highest education level: Not on file  Occupational History  . Occupation: Presenter, broadcasting [employer]  Social Needs  . Financial resource strain: Not on file  . Food insecurity    Worry: Not on file    Inability: Not on file  . Transportation needs    Medical: Not on file    Non-medical: Not on file  Tobacco Use  . Smoking status: Former Research scientist (life sciences)  . Smokeless tobacco: Never Used  Substance and Sexual Activity  . Alcohol use: Yes    Comment: rare  . Drug  use: No  . Sexual activity: Not on file  Lifestyle  . Physical activity    Days per week: Not on file    Minutes per session: Not on file  . Stress: Not on file  Relationships  . Social Herbalist on phone: Not on file    Gets together: Not on file    Attends religious service: Not on file    Active member of club or organization: Not on file    Attends meetings of clubs or organizations: Not on file    Relationship status: Not on file  Other Topics Concern  . Not on file  Social History Narrative  . Not on file   She works in scheduling at [employer]  Allergies: Allergies  Allergen Reactions  . Clarithromycin     REACTION: nausea  . Codeine   . Erythromycin   . Moxifloxacin     REACTION: nausea    Outpatient Meds: Current Outpatient Medications  Medication Sig Dispense Refill  . albuterol (PROVENTIL HFA;VENTOLIN HFA) 108 (90 BASE) MCG/ACT inhaler Inhale 2 puffs into the lungs every 6 (six) hours as needed for wheezing or shortness of breath (or cough). 1 Inhaler 1  . AMBULATORY NON FORMULARY MEDICATION Medication Name: Ultimate Flora Probiotic-30B Live Cultures- 1 capsule daily    . AMBULATORY NON FORMULARY MEDICATION Medication Name: Amberen OTC menopausal Relief-- 2 tablets daily    . APPLE CIDER VINEGAR PO 2 gummys daily    . Biotin 10000 MCG TABS Take 1 tablet by mouth daily.    Marland Kitchen CALCIUM-VITAMIN D PO Take 2 tablets by mouth daily.    . Cholecalciferol (VITAMIN D3) 1000 UNITS CAPS Take 1 capsule by mouth daily.      . Cyanocobalamin (VITAMIN B 12 PO) Take 100 mg by mouth daily.     Marland Kitchen dicyclomine (BENTYL) 10 MG capsule Take 1 capsule (10 mg total) by mouth 3 (three) times daily as needed for up to 14 days for spasms. 16 capsule 1  . ferrous sulfate 325 (65 FE) MG tablet Take 325 mg by mouth daily with breakfast.    . fexofenadine (ALLEGRA) 180 MG tablet Take 180 mg by mouth daily.      . Flaxseed, Linseed, 1000 MG CAPS Take 2 capsules by mouth  daily.    Marland Kitchen L-Lysine 500 MG TABS Take 2 tablets by mouth daily.    . Multiple Vitamin (MULTIVITAMIN) tablet Take 1 tablet by mouth daily.      . Multiple Vitamins-Minerals (HAIR SKIN & NAILS ADVANCED PO) Take 1 tablet by mouth daily.    Marland Kitchen NIACIN PO Take 1 tablet by mouth daily.    . norgestimate-ethinyl estradiol (SPRINTEC 28) 0.25-35 MG-MCG tablet Take 1 tablet by mouth daily.    Marland Kitchen  ondansetron (ZOFRAN) 4 MG tablet Take 1 tablet (4 mg total) by mouth daily as needed. 14 tablet 0  . pantoprazole (PROTONIX) 40 MG tablet Take 1 tablet (40 mg total) by mouth daily. 90 tablet 1  . triamcinolone cream (KENALOG) 0.1 % Apply 1 application topically 2 (two) times daily. (Patient taking differently: Apply 1 application topically 2 (two) times daily as needed. ) 30 g 0  . vitamin E 400 UNIT capsule Take 400 Units by mouth daily.     No current facility-administered medications for this visit.       ___________________________________________________________________ Objective   Exam:  BP 100/62   Pulse (!) 59   Temp 98.1 F (36.7 C)   Ht 5' 6.25" (1.683 m)   Wt 160 lb (72.6 kg)   SpO2 99%   BMI 25.63 kg/m    General: Well-appearing, pleasant and conversational  Eyes: sclera anicteric, no redness  ENT: oral mucosa moist without lesions, no cervical or supraclavicular lymphadenopathy  CV: RRR without murmur, S1/S2, no JVD, no peripheral edema  Resp: clear to auscultation bilaterally, normal RR and effort noted  GI: soft, no tenderness, with active bowel sounds. No guarding or palpable organomegaly noted.  Skin; warm and dry, no rash or jaundice noted  Neuro: awake, alert and oriented x 3. Normal gross motor function and fluent speech  Labs:  CBC Latest Ref Rng & Units 07/17/2019 11/08/2014 09/05/2013  WBC 4.0 - 10.5 K/uL 7.7 7.6 7.6  Hemoglobin 12.0 - 15.0 g/dL 13.5 13.0 12.6  Hematocrit 36.0 - 46.0 % 39.1 39.5 36.9  Platelets 150 - 400 K/uL 239 202.0 216.0   CMP Latest Ref Rng  & Units 07/17/2019 11/08/2014 09/05/2013  Glucose 70 - 99 mg/dL 110(H) 74 84  BUN 6 - 20 mg/dL 9 12 11   Creatinine 0.44 - 1.00 mg/dL 0.89 0.9 0.9  Sodium 135 - 145 mmol/L 134(L) 139 139  Potassium 3.5 - 5.1 mmol/L 3.7 4.2 3.6  Chloride 98 - 111 mmol/L 101 104 104  CO2 22 - 32 mmol/L 24 24 30   Calcium 8.9 - 10.3 mg/dL 8.9 9.2 8.7  Total Protein 6.5 - 8.1 g/dL 7.2 6.9 6.5  Total Bilirubin 0.3 - 1.2 mg/dL 0.9 0.4 0.7  Alkaline Phos 38 - 126 U/L 41 52 56  AST 15 - 41 U/L 21 18 16   ALT 0 - 44 U/L 21 13 17    Negative H. pylori IgG antibody in 2012  07/17/2019: Negative U/A and Urine pregnancy test  Radiologic Studies:  CLINICAL DATA:  Abdominal pain   EXAM: CT ABDOMEN AND PELVIS WITH CONTRAST   TECHNIQUE: Multidetector CT imaging of the abdomen and pelvis was performed using the standard protocol following bolus administration of intravenous contrast.   CONTRAST:  21mL OMNIPAQUE IOHEXOL 300 MG/ML  SOLN   COMPARISON:  February 04, 2016   FINDINGS: Lower chest: Lung bases are clear.   Hepatobiliary: No focal liver lesions are evident. Gallbladder wall is not appreciably thickened. There is no biliary duct dilatation.   Pancreas: There is no pancreatic mass or inflammatory focus.   Spleen: No splenic lesions are evident.   Adrenals/Urinary Tract: Adrenals bilaterally appear unremarkable. There is no evident renal mass or hydronephrosis on either side. There is no intrarenal or ureteral calculus on either side. Urinary bladder is midline with wall thickness within normal limits.   Stomach/Bowel: There is no appreciable bowel wall or mesenteric thickening. No evident bowel obstruction. No diverticulitis. Terminal ileum appears normal. There is no  free air or portal venous air.   Vascular/Lymphatic: There is no abdominal aortic aneurysm. No vascular lesions are evident. No adenopathy is appreciable in the abdomen or pelvis.   Reproductive: The uterus is midline and slightly  canted toward the left. No pelvic mass evident.   Other: Appendix appears normal. No abscess or ascites is appreciable in the abdomen or pelvis.   Musculoskeletal: There are no blastic or lytic bone lesions. There is no evident intramuscular or abdominal wall lesion.   IMPRESSION: 1. A cause for patient's symptoms has not been established with this study.   2. No diverticulitis evident. No bowel obstruction. No abscess in the abdomen or pelvis. Appendix appears normal.   3. No evident renal or ureteral calculus. No hydronephrosis. Urinary bladder wall thickness within normal limits.     Electronically Signed   By: Lowella Grip III M.D.   On: 07/17/2019 12:59  Images personally reviewed.  Limited by lack of oral contrast.  Assessment: Encounter Diagnoses  Name Primary?  . Lower abdominal pain Yes  . Chronic diarrhea     Most consistent with IBS, but recent escalation raises question of IBD. The intermittent dysphagia sounds benign.  Plan:  Colonoscopy.  She is agreeable after discussion of procedure and risks.  The benefits and risks of the planned procedure were described in detail with the patient or (when appropriate) their health care proxy.  Risks were outlined as including, but not limited to, bleeding, infection, perforation, adverse medication reaction leading to cardiac or pulmonary decompensation.  The limitation of incomplete mucosal visualization was also discussed.  No guarantees or warranties were given.  Decrease dicyclomine to twice daily.  If she is still well after a week, decrease it to once every evening, since the episodes have occurred overnight for unclear reasons.  Thank you for the courtesy of this consult.  Please call me with any questions or concerns.  Nelida Meuse III  CC: Referring provider noted above

## 2019-07-30 ENCOUNTER — Telehealth: Payer: Self-pay

## 2019-07-30 ENCOUNTER — Other Ambulatory Visit: Payer: Self-pay

## 2019-07-30 DIAGNOSIS — Z20822 Contact with and (suspected) exposure to covid-19: Secondary | ICD-10-CM

## 2019-07-30 NOTE — Telephone Encounter (Signed)
Covid-19 screening questions   Do you now or have you had a fever in the last 14 days?  Do you have any respiratory symptoms of shortness of breath or cough now or in the last 14 days?  Do you have any family members or close contacts with diagnosed or suspected Covid-19 in the past 14 days?  Have you been tested for Covid-19 and found to be positive?      L/m to c/b.

## 2019-07-31 ENCOUNTER — Other Ambulatory Visit: Payer: Self-pay

## 2019-07-31 ENCOUNTER — Ambulatory Visit (AMBULATORY_SURGERY_CENTER): Payer: BC Managed Care – PPO | Admitting: Gastroenterology

## 2019-07-31 ENCOUNTER — Encounter: Payer: Self-pay | Admitting: Gastroenterology

## 2019-07-31 VITALS — BP 105/51 | HR 65 | Temp 98.9°F | Resp 14 | Ht 66.25 in | Wt 160.0 lb

## 2019-07-31 DIAGNOSIS — K529 Noninfective gastroenteritis and colitis, unspecified: Secondary | ICD-10-CM | POA: Diagnosis not present

## 2019-07-31 DIAGNOSIS — K5730 Diverticulosis of large intestine without perforation or abscess without bleeding: Secondary | ICD-10-CM | POA: Diagnosis not present

## 2019-07-31 DIAGNOSIS — D123 Benign neoplasm of transverse colon: Secondary | ICD-10-CM | POA: Diagnosis not present

## 2019-07-31 DIAGNOSIS — D122 Benign neoplasm of ascending colon: Secondary | ICD-10-CM

## 2019-07-31 DIAGNOSIS — R197 Diarrhea, unspecified: Secondary | ICD-10-CM | POA: Diagnosis not present

## 2019-07-31 DIAGNOSIS — R1030 Lower abdominal pain, unspecified: Secondary | ICD-10-CM

## 2019-07-31 DIAGNOSIS — K635 Polyp of colon: Secondary | ICD-10-CM | POA: Diagnosis not present

## 2019-07-31 LAB — NOVEL CORONAVIRUS, NAA: SARS-CoV-2, NAA: NOT DETECTED

## 2019-07-31 MED ORDER — SODIUM CHLORIDE 0.9 % IV SOLN: 500.0000 mL | Freq: Once | INTRAVENOUS | Status: DC

## 2019-07-31 NOTE — Progress Notes (Signed)
Report given to PACU, vss 

## 2019-07-31 NOTE — Progress Notes (Signed)
Called to room to assist during endoscopic procedure.  Patient ID and intended procedure confirmed with present staff. Received instructions for my participation in the procedure from the performing physician.  

## 2019-07-31 NOTE — Op Note (Signed)
[facility] Patient Name: Andrea Bonilla Procedure Date: 07/31/2019 2:10 PM MRN: [id number] Endoscopist: Mallie Mussel L. Loletha Carrow , MD Age: 48 Referring MD:  Date of Birth: [birth year] Gender: Female Account #: [id number] Procedure:                Colonoscopy Indications:              Lower abdominal pain, Diarrhea (episodic, usually                            occurring at night - lately improved on dicyclomine) Medicines:                Monitored Anesthesia Care Procedure:                Pre-Anesthesia Assessment:                           - Prior to the procedure, a History and Physical                            was performed, and patient medications and                            allergies were reviewed. The patient's tolerance of                            previous anesthesia was also reviewed. The risks                            and benefits of the procedure and the sedation                            options and risks were discussed with the patient.                            All questions were answered, and informed consent                            was obtained. Prior Anticoagulants: The patient has                            taken no previous anticoagulant or antiplatelet                            agents. ASA Grade Assessment: II - A patient with                            mild systemic disease. After reviewing the risks                            and benefits, the patient was deemed in                            satisfactory condition to undergo the procedure.  After obtaining informed consent, the colonoscope                            was passed under direct vision. Throughout the                            procedure, the patient's blood pressure, pulse, and                            oxygen saturations were monitored continuously. The                            Colonoscope was introduced through the anus and                            advanced  to the the terminal ileum, with                            identification of the appendiceal orifice and IC                            valve. The colonoscopy was performed without                            difficulty. The patient tolerated the procedure                            well. The quality of the bowel preparation was                            excellent. The terminal ileum, ileocecal valve,                            appendiceal orifice, and rectum were photographed. Scope In: 2:23:28 PM Scope Out: 2:45:33 PM Scope Withdrawal Time: 0 hours 18 minutes 27 seconds  Total Procedure Duration: 0 hours 22 minutes 5 seconds  Findings:                 The perianal and digital rectal examinations were                            normal.                           The terminal ileum appeared normal.                           A 12 mm polyp was found in the ascending colon. The                            polyp was semi-sessile. The polyp was removed with                            a piecemeal technique using a hot and cold snare.  Resection and retrieval were complete.                           An 8 mm polyp was found in the distal transverse                            colon. The polyp was semi-sessile. The polyp was                            removed with a cold snare. Resection and retrieval                            were complete.                           Multiple diverticula were found in the left colon.                           The exam was otherwise without abnormality on                            direct and retroflexion views. Complications:            No immediate complications. Estimated Blood Loss:     Estimated blood loss was minimal. Impression:               - The examined portion of the ileum was normal.                           - One 12 mm polyp in the ascending colon, removed                            piecemeal using a hot snare. Resected and  retrieved.                           - One 8 mm polyp in the distal transverse colon,                            removed with a cold snare. Resected and retrieved.                           - Diverticulosis in the left colon.                           - The examination was otherwise normal on direct                            and retroflexion views.                           Overall symptom complex most consistent with IBS. Recommendation:           - Patient has a contact number available for  emergencies. The signs and symptoms of potential                            delayed complications were discussed with the                            patient. Return to normal activities tomorrow.                            Written discharge instructions were provided to the                            patient.                           - Resume previous diet.                           - Continue present medications, including                            dicyclomine.                           - Await pathology results.                           - Repeat colonoscopy is recommended for                            surveillance. The colonoscopy date will be                            determined after pathology results from today's                            exam become available for review. Henry L. Loletha Carrow, MD 07/31/2019 2:58:28 PM This report has been signed electronically.

## 2019-07-31 NOTE — Patient Instructions (Signed)
Handouts Provided:  Polyps and Diverticulosis     YOU HAD AN ENDOSCOPIC PROCEDURE TODAY AT THE [facility]:   Refer to the procedure report that was given to you for any specific questions about what was found during the examination.  If the procedure report does not answer your questions, please call your gastroenterologist to clarify.  If you requested that your care partner not be given the details of your procedure findings, then the procedure report has been included in a sealed envelope for you to review at your convenience later.  YOU SHOULD EXPECT: Some feelings of bloating in the abdomen. Passage of more gas than usual.  Walking can help get rid of the air that was put into your GI tract during the procedure and reduce the bloating. If you had a lower endoscopy (such as a colonoscopy or flexible sigmoidoscopy) you may notice spotting of blood in your stool or on the toilet paper. If you underwent a bowel prep for your procedure, you may not have a normal bowel movement for a few days.  Please Note:  You might notice some irritation and congestion in your nose or some drainage.  This is from the oxygen used during your procedure.  There is no need for concern and it should clear up in a day or so.  SYMPTOMS TO REPORT IMMEDIATELY:   Following lower endoscopy (colonoscopy or flexible sigmoidoscopy):  Excessive amounts of blood in the stool  Significant tenderness or worsening of abdominal pains  Swelling of the abdomen that is new, acute  Fever of 100F or higher  For urgent or emergent issues, a gastroenterologist can be reached at any hour by calling [phone].   DIET:  We do recommend a small meal at first, but then you may proceed to your regular diet.  Drink plenty of fluids but you should avoid alcoholic beverages for 24 hours.  ACTIVITY:  You should plan to take it easy for the rest of today and you should NOT DRIVE or use heavy machinery until tomorrow  (because of the sedation medicines used during the test).    FOLLOW UP: Our staff will call the number listed on your records 48-72 hours following your procedure to check on you and address any questions or concerns that you may have regarding the information given to you following your procedure. If we do not reach you, we will leave a message.  We will attempt to reach you two times.  During this call, we will ask if you have developed any symptoms of COVID 19. If you develop any symptoms (ie: fever, flu-like symptoms, shortness of breath, cough etc.) before then, please call [phone].  If you test positive for Covid 19 in the 2 weeks post procedure, please call and report this information to us.    If any biopsies were taken you will be contacted by phone or by letter within the next 1-3 weeks.  Please call us at [phone] if you have not heard about the biopsies in 3 weeks.    SIGNATURES/CONFIDENTIALITY: You and/or your care partner have signed paperwork which will be entered into your electronic medical record.  These signatures attest to the fact that that the information above on your After Visit Summary has been reviewed and is understood.  Full responsibility of the confidentiality of this discharge information lies with you and/or your care-partner.  

## 2019-07-31 NOTE — Progress Notes (Signed)
VITALS CW TEMP MB

## 2019-08-02 ENCOUNTER — Telehealth: Payer: Self-pay

## 2019-08-02 NOTE — Telephone Encounter (Signed)
  Follow up Call-  Call back number 07/31/2019  Post procedure Call Back phone  # [phone]  Some recent data might be hidden     Patient questions:  Do you have a fever, pain , or abdominal swelling? No. Pain Score  0 *  Have you tolerated food without any problems? Yes.    Have you been able to return to your normal activities? Yes.    Do you have any questions about your discharge instructions: Diet   No. Medications  No. Follow up visit  No.  Do you have questions or concerns about your Care? No.  Actions: * If pain score is 4 or above: No action needed, pain <4. 1. Have you developed a fever since your procedure? no  2.   Have you had an respiratory symptoms (SOB or cough) since your procedure? no  3.   Have you tested positive for COVID 19 since your procedure no  4.   Have you had any family members/close contacts diagnosed with the COVID 19 since your procedure?  no   If yes to any of these questions please route to Joylene John, RN and Alphonsa Gin, Therapist, sports.

## 2019-08-03 ENCOUNTER — Encounter: Payer: Self-pay | Admitting: Gastroenterology

## 2019-08-23 DIAGNOSIS — N390 Urinary tract infection, site not specified: Secondary | ICD-10-CM | POA: Diagnosis not present

## 2019-08-24 ENCOUNTER — Other Ambulatory Visit: Payer: Self-pay | Admitting: Gastroenterology

## 2019-08-31 DIAGNOSIS — R350 Frequency of micturition: Secondary | ICD-10-CM | POA: Diagnosis not present

## 2019-09-20 DIAGNOSIS — L814 Other melanin hyperpigmentation: Secondary | ICD-10-CM | POA: Diagnosis not present

## 2019-09-20 DIAGNOSIS — D229 Melanocytic nevi, unspecified: Secondary | ICD-10-CM | POA: Diagnosis not present

## 2019-09-20 DIAGNOSIS — L821 Other seborrheic keratosis: Secondary | ICD-10-CM | POA: Diagnosis not present

## 2019-09-20 DIAGNOSIS — L819 Disorder of pigmentation, unspecified: Secondary | ICD-10-CM | POA: Diagnosis not present

## 2019-09-27 DIAGNOSIS — R350 Frequency of micturition: Secondary | ICD-10-CM | POA: Diagnosis not present

## 2019-09-27 DIAGNOSIS — R3915 Urgency of urination: Secondary | ICD-10-CM | POA: Diagnosis not present

## 2019-09-27 DIAGNOSIS — N3020 Other chronic cystitis without hematuria: Secondary | ICD-10-CM | POA: Diagnosis not present

## 2019-10-03 DIAGNOSIS — Z139 Encounter for screening, unspecified: Secondary | ICD-10-CM | POA: Diagnosis not present

## 2019-10-03 DIAGNOSIS — Z1329 Encounter for screening for other suspected endocrine disorder: Secondary | ICD-10-CM | POA: Diagnosis not present

## 2019-10-03 DIAGNOSIS — Z13228 Encounter for screening for other metabolic disorders: Secondary | ICD-10-CM | POA: Diagnosis not present

## 2019-10-03 DIAGNOSIS — E785 Hyperlipidemia, unspecified: Secondary | ICD-10-CM | POA: Diagnosis not present

## 2019-10-17 ENCOUNTER — Other Ambulatory Visit: Payer: Self-pay | Admitting: Gastroenterology

## 2019-10-17 ENCOUNTER — Other Ambulatory Visit: Payer: Self-pay | Admitting: Internal Medicine

## 2019-10-18 DIAGNOSIS — R7989 Other specified abnormal findings of blood chemistry: Secondary | ICD-10-CM | POA: Diagnosis not present

## 2019-10-18 DIAGNOSIS — Z6825 Body mass index (BMI) 25.0-25.9, adult: Secondary | ICD-10-CM | POA: Diagnosis not present

## 2019-10-18 DIAGNOSIS — Z01419 Encounter for gynecological examination (general) (routine) without abnormal findings: Secondary | ICD-10-CM | POA: Diagnosis not present

## 2019-10-31 ENCOUNTER — Other Ambulatory Visit: Payer: Self-pay

## 2019-10-31 DIAGNOSIS — Z20822 Contact with and (suspected) exposure to covid-19: Secondary | ICD-10-CM

## 2019-11-02 LAB — NOVEL CORONAVIRUS, NAA: SARS-CoV-2, NAA: NOT DETECTED

## 2019-11-07 DIAGNOSIS — R87610 Atypical squamous cells of undetermined significance on cytologic smear of cervix (ASC-US): Secondary | ICD-10-CM | POA: Diagnosis not present

## 2019-11-08 ENCOUNTER — Ambulatory Visit (INDEPENDENT_AMBULATORY_CARE_PROVIDER_SITE_OTHER): Payer: BC Managed Care – PPO | Admitting: Internal Medicine

## 2019-11-08 ENCOUNTER — Other Ambulatory Visit: Payer: Self-pay

## 2019-11-08 ENCOUNTER — Other Ambulatory Visit: Payer: BC Managed Care – PPO

## 2019-11-08 ENCOUNTER — Encounter: Payer: Self-pay | Admitting: Internal Medicine

## 2019-11-08 VITALS — BP 114/78 | HR 78 | Temp 98.3°F | Ht 66.5 in | Wt 167.0 lb

## 2019-11-08 DIAGNOSIS — Z114 Encounter for screening for human immunodeficiency virus [HIV]: Secondary | ICD-10-CM

## 2019-11-08 DIAGNOSIS — E785 Hyperlipidemia, unspecified: Secondary | ICD-10-CM | POA: Diagnosis not present

## 2019-11-08 DIAGNOSIS — Z20828 Contact with and (suspected) exposure to other viral communicable diseases: Secondary | ICD-10-CM

## 2019-11-08 DIAGNOSIS — Z0000 Encounter for general adult medical examination without abnormal findings: Secondary | ICD-10-CM

## 2019-11-08 DIAGNOSIS — Z20822 Contact with and (suspected) exposure to covid-19: Secondary | ICD-10-CM

## 2019-11-08 DIAGNOSIS — N870 Mild cervical dysplasia: Secondary | ICD-10-CM | POA: Diagnosis not present

## 2019-11-08 LAB — SARS-COV-2 IGG: SARS-COV-2 IgG: 0.02

## 2019-11-08 NOTE — Patient Instructions (Addendum)
Please continue all other medications as before, and refills have been done if requested.  Please have the pharmacy call with any other refills you may need.  Please continue your efforts at being more active, low cholesterol diet, and weight control.  You are otherwise up to date with prevention measures today.  Please keep your appointments with your specialists as you may have planned  Please go to the LAB in the Basement (turn left off the elevator) for the tests to be done today - just the antibody testing today  You will be contacted by phone if any changes need to be made immediately.  Otherwise, you will receive a letter about your results with an explanation, but please check with MyChart first.  Please remember to sign up for MyChart if you have not done so, as this will be important to you in the future with finding out test results, communicating by private email, and scheduling acute appointments online when needed.  Please return in 1 year for your yearly visit, or sooner if needed, with Lab testing done 3-5 days before

## 2019-11-08 NOTE — Progress Notes (Addendum)
Subjective:    Patient ID: Andrea Bonilla, female    DOB: 1971/07/28, 48 y.o.   MRN: [id number]  HPI  Here for wellness and f/u;  Overall doing ok;  Pt denies Chest pain, worsening SOB, DOE, wheezing, orthopnea, PND, worsening LE edema, palpitations, dizziness or syncope.  Pt denies neurological change such as new headache, facial or extremity weakness.  Pt denies polydipsia, polyuria, or low sugar symptoms. Pt states overall good compliance with treatment and medications, good tolerability, and has been trying to follow appropriate diet.  Pt denies worsening depressive symptoms, suicidal ideation or panic. No fever, night sweats, wt loss, loss of appetite, or other constitutional symptoms.  Pt states good ability with ADL's, has low fall risk, home safety reviewed and adequate, no other significant changes in hearing or vision, and only occasionally active with exercise.  No new complaints.  Did have a URI several months ago, and both husband and son had COVID about the same time in her home. Past Medical History:  Diagnosis Date  . ALLERGIC RHINITIS   . ASTHMA   . Asthma   . HYPERLIPIDEMIA   . IBS (irritable bowel syndrome)   . Kidney stones   . OSTEOPENIA   . Splenic lesion   . STRESS FRACTURE, FOOT    Past Surgical History:  Procedure Laterality Date  . BREAST SURGERY    . skin graft Right forehead      reports that she has quit smoking. She has never used smokeless tobacco. She reports current alcohol use. She reports that she does not use drugs. family history includes Colon polyps in her brother, father, and mother; Coronary artery disease in an other family member; Diabetes in an other family member; Heart disease in her mother; Hyperlipidemia in her mother; Hypertension in her mother and another family member; Stroke in an other family member. Allergies  Allergen Reactions  . Clarithromycin     REACTION: nausea  . Codeine   . Erythromycin   . Moxifloxacin     REACTION:  nausea   Current Outpatient Medications on File Prior to Visit  Medication Sig Dispense Refill  . albuterol (PROVENTIL HFA;VENTOLIN HFA) 108 (90 BASE) MCG/ACT inhaler Inhale 2 puffs into the lungs every 6 (six) hours as needed for wheezing or shortness of breath (or cough). 1 Inhaler 1  . AMBULATORY NON FORMULARY MEDICATION Medication Name: Ultimate Flora Probiotic-30B Live Cultures- 1 capsule daily    . AMBULATORY NON FORMULARY MEDICATION Medication Name: Amberen OTC menopausal Relief-- 2 tablets daily    . APPLE CIDER VINEGAR PO 2 gummys daily    . Biotin 10000 MCG TABS Take 1 tablet by mouth daily.    Marland Kitchen CALCIUM-VITAMIN D PO Take 2 tablets by mouth daily.    . Cholecalciferol (VITAMIN D3) 1000 UNITS CAPS Take 1 capsule by mouth daily.      . Cyanocobalamin (VITAMIN B 12 PO) Take 100 mg by mouth daily.     Marland Kitchen dicyclomine (BENTYL) 10 MG capsule Take 1 capsule by mouth twice daily 60 capsule 0  . ferrous sulfate 325 (65 FE) MG tablet Take 325 mg by mouth daily with breakfast.    . fexofenadine (ALLEGRA) 180 MG tablet Take 180 mg by mouth daily.      . Flaxseed, Linseed, 1000 MG CAPS Take 2 capsules by mouth daily.    Marland Kitchen L-Lysine 500 MG TABS Take 2 tablets by mouth daily.    . Multiple Vitamin (MULTIVITAMIN) tablet Take 1 tablet by  mouth daily.      . Multiple Vitamins-Minerals (HAIR SKIN & NAILS ADVANCED PO) Take 1 tablet by mouth daily.    Marland Kitchen NIACIN PO Take 1 tablet by mouth daily.    . norgestimate-ethinyl estradiol (SPRINTEC 28) 0.25-35 MG-MCG tablet Take 1 tablet by mouth daily.    . ondansetron (ZOFRAN) 4 MG tablet Take 1 tablet (4 mg total) by mouth daily as needed. 14 tablet 0  . pantoprazole (PROTONIX) 40 MG tablet Take 1 tablet by mouth once daily 90 tablet 0  . triamcinolone cream (KENALOG) 0.1 % Apply 1 application topically 2 (two) times daily. (Patient taking differently: Apply 1 application topically 2 (two) times daily as needed. ) 30 g 0  . vitamin E 400 UNIT capsule Take 400  Units by mouth daily.     No current facility-administered medications on file prior to visit.    Review of Systems  Constitutional: Negative for other unusual diaphoresis or sweats HENT: Negative for ear discharge or swelling Eyes: Negative for other worsening visual disturbances Respiratory: Negative for stridor or other swelling  Gastrointestinal: Negative for worsening distension or other blood Genitourinary: Negative for retention or other urinary change Musculoskeletal: Negative for other MSK pain or swelling Skin: Negative for color change or other new lesions Neurological: Negative for worsening tremors and other numbness  Psychiatric/Behavioral: Negative for worsening agitation or other fatigue All otherwise neg per pt    Objective:   Physical Exam BP 114/78   Pulse 78   Temp 98.3 F (36.8 C) (Oral)   Ht 5' 6.5" (1.689 m)   Wt 167 lb (75.8 kg)   SpO2 98%   BMI 26.55 kg/m  VS noted,  Constitutional: Pt appears in NAD HENT: Head: NCAT.  Right Ear: External ear normal.  Left Ear: External ear normal.  Eyes: . Pupils are equal, round, and reactive to light. Conjunctivae and EOM are normal Nose: without d/c or deformity Neck: Neck supple. Gross normal ROM Cardiovascular: Normal rate and regular rhythm.   Pulmonary/Chest: Effort normal and breath sounds without rales or wheezing.  Abd:  Soft, NT, ND, + BS, no organomegaly Neurological: Pt is alert. At baseline orientation, motor grossly intact Skin: Skin is warm. No rashes, other new lesions, no LE edema Psychiatric: Pt behavior is normal without agitation  All otherwise neg per pt Lab Results  Component Value Date   WBC 7.7 07/17/2019   HGB 13.5 07/17/2019   HCT 39.1 07/17/2019   PLT 239 07/17/2019   GLUCOSE 110 (H) 07/17/2019   CHOL 238 (H) 11/08/2014   TRIG 203.0 (H) 11/08/2014   HDL 65.00 11/08/2014   LDLDIRECT 139.2 11/08/2014   ALT 21 07/17/2019   AST 21 07/17/2019   NA 134 (L) 07/17/2019   K 3.7  07/17/2019   CL 101 07/17/2019   CREATININE 0.89 07/17/2019   BUN 9 07/17/2019   CO2 24 07/17/2019   TSH 2.98 11/08/2014   INR 0.89 06/17/2011       Assessment & Plan:

## 2019-11-09 LAB — HIV ANTIBODY (ROUTINE TESTING W REFLEX): HIV 1&2 Ab, 4th Generation: NONREACTIVE

## 2019-11-10 ENCOUNTER — Encounter: Payer: Self-pay | Admitting: Internal Medicine

## 2019-11-10 NOTE — Addendum Note (Signed)
Addended by: Biagio Borg on: 11/10/2019 04:50 PM   Modules accepted: Orders

## 2019-11-10 NOTE — Assessment & Plan Note (Signed)
Consider statin, for lower chol diet

## 2019-11-10 NOTE — Assessment & Plan Note (Signed)

## 2019-11-10 NOTE — Assessment & Plan Note (Signed)
For sars igG

## 2019-11-19 DIAGNOSIS — N390 Urinary tract infection, site not specified: Secondary | ICD-10-CM | POA: Diagnosis not present

## 2019-12-17 ENCOUNTER — Other Ambulatory Visit: Payer: Self-pay | Admitting: Gastroenterology

## 2020-01-18 DIAGNOSIS — Z13820 Encounter for screening for osteoporosis: Secondary | ICD-10-CM | POA: Diagnosis not present

## 2020-02-21 ENCOUNTER — Other Ambulatory Visit: Payer: Self-pay | Admitting: Internal Medicine

## 2020-02-21 ENCOUNTER — Other Ambulatory Visit: Payer: Self-pay | Admitting: Gastroenterology

## 2020-02-21 NOTE — Telephone Encounter (Signed)
Please refill as per office routine med refill policy (all routine meds refilled for 3 mo or monthly per pt preference up to one year from last visit, then month to month grace period for 3 mo, then further med refills will have to be denied)  

## 2020-03-19 DIAGNOSIS — N390 Urinary tract infection, site not specified: Secondary | ICD-10-CM | POA: Diagnosis not present

## 2020-04-08 DIAGNOSIS — R309 Painful micturition, unspecified: Secondary | ICD-10-CM | POA: Diagnosis not present

## 2020-04-08 DIAGNOSIS — N390 Urinary tract infection, site not specified: Secondary | ICD-10-CM | POA: Diagnosis not present

## 2020-05-07 DIAGNOSIS — R87810 Cervical high risk human papillomavirus (HPV) DNA test positive: Secondary | ICD-10-CM | POA: Diagnosis not present

## 2020-05-07 DIAGNOSIS — R87610 Atypical squamous cells of undetermined significance on cytologic smear of cervix (ASC-US): Secondary | ICD-10-CM | POA: Diagnosis not present

## 2020-05-15 ENCOUNTER — Other Ambulatory Visit: Payer: Self-pay | Admitting: Internal Medicine

## 2020-05-15 NOTE — Telephone Encounter (Signed)
Please refill as per office routine med refill policy (all routine meds refilled for 3 mo or monthly per pt preference up to one year from last visit, then month to month grace period for 3 mo, then further med refills will have to be denied)  

## 2020-05-22 ENCOUNTER — Other Ambulatory Visit: Payer: Self-pay

## 2020-05-22 MED ORDER — PANTOPRAZOLE SODIUM 40 MG PO TBEC: 40.0000 mg | DELAYED_RELEASE_TABLET | Freq: Every day | ORAL | 0 refills | Status: DC

## 2020-05-23 ENCOUNTER — Ambulatory Visit (INDEPENDENT_AMBULATORY_CARE_PROVIDER_SITE_OTHER): Payer: BC Managed Care – PPO

## 2020-05-23 ENCOUNTER — Ambulatory Visit (INDEPENDENT_AMBULATORY_CARE_PROVIDER_SITE_OTHER): Payer: BC Managed Care – PPO | Admitting: Podiatry

## 2020-05-23 ENCOUNTER — Other Ambulatory Visit: Payer: Self-pay

## 2020-05-23 DIAGNOSIS — M722 Plantar fascial fibromatosis: Secondary | ICD-10-CM | POA: Diagnosis not present

## 2020-05-23 DIAGNOSIS — M79671 Pain in right foot: Secondary | ICD-10-CM

## 2020-05-25 NOTE — Progress Notes (Signed)
Subjective:  Patient ID: Andrea Bonilla, female    DOB: 1971-02-22,  MRN: [id number]  Chief Complaint  Patient presents with  . Foot Pain    pt is here for right foot pain, pain is located at the bottom of the right heel, pt states that the pain has been going on for about 2 months, pt also states that the pain will often vary, and that it is an aching sensation.    49 y.o. female presents with the above complaint.  Patient presents with right heel pain that has been going on for 2 to 5 months.  Patient states it hurts right on the bottom of the heel.  Patient has a history of plantar fasciitis.  Patient has current custom orthotics that she has brought with her and she would like for me to get them evaluated.  Patient states his pain when worse walking on it.  Patient experienced post static dyskinesia type of symptoms.  Pain scale is 5 out of 10.  She has not seen anyone else prior to seeing me.  She denies any other acute treatment options.   Review of Systems: Negative except as noted in the HPI. Denies N/V/F/Ch.  Past Medical History:  Diagnosis Date  . ALLERGIC RHINITIS   . ASTHMA   . Asthma   . HYPERLIPIDEMIA   . IBS (irritable bowel syndrome)   . Kidney stones   . OSTEOPENIA   . Splenic lesion   . STRESS FRACTURE, FOOT     Current Outpatient Medications:  .  albuterol (PROVENTIL HFA;VENTOLIN HFA) 108 (90 BASE) MCG/ACT inhaler, Inhale 2 puffs into the lungs every 6 (six) hours as needed for wheezing or shortness of breath (or cough)., Disp: 1 Inhaler, Rfl: 1 .  AMBULATORY NON FORMULARY MEDICATION, Medication Name: Ultimate Flora Probiotic-30B Live Cultures- 1 capsule daily, Disp: , Rfl:  .  AMBULATORY NON FORMULARY MEDICATION, Medication Name: Amberen OTC menopausal Relief-- 2 tablets daily, Disp: , Rfl:  .  APPLE CIDER VINEGAR PO, 2 gummys daily, Disp: , Rfl:  .  Biotin 10000 MCG TABS, Take 1 tablet by mouth daily., Disp: , Rfl:  .  CALCIUM-VITAMIN D PO, Take 2 tablets by  mouth daily., Disp: , Rfl:  .  Cholecalciferol (VITAMIN D3) 1000 UNITS CAPS, Take 1 capsule by mouth daily.  , Disp: , Rfl:  .  Cyanocobalamin (VITAMIN B 12 PO), Take 100 mg by mouth daily. , Disp: , Rfl:  .  dicyclomine (BENTYL) 10 MG capsule, Take 1 capsule by mouth twice daily, Disp: 60 capsule, Rfl: 0 .  dicyclomine (BENTYL) 10 MG capsule, Take 1 capsule by mouth twice daily, Disp: 60 capsule, Rfl: 3 .  ferrous sulfate 325 (65 FE) MG tablet, Take 325 mg by mouth daily with breakfast., Disp: , Rfl:  .  fexofenadine (ALLEGRA) 180 MG tablet, Take 180 mg by mouth daily.  , Disp: , Rfl:  .  Flaxseed, Linseed, 1000 MG CAPS, Take 2 capsules by mouth daily., Disp: , Rfl:  .  L-Lysine 500 MG TABS, Take 2 tablets by mouth daily., Disp: , Rfl:  .  Multiple Vitamin (MULTIVITAMIN) tablet, Take 1 tablet by mouth daily.  , Disp: , Rfl:  .  Multiple Vitamins-Minerals (HAIR SKIN & NAILS ADVANCED PO), Take 1 tablet by mouth daily., Disp: , Rfl:  .  NIACIN PO, Take 1 tablet by mouth daily., Disp: , Rfl:  .  norgestimate-ethinyl estradiol (SPRINTEC 28) 0.25-35 MG-MCG tablet, Take 1 tablet by mouth daily.,  Disp: , Rfl:  .  ondansetron (ZOFRAN) 4 MG tablet, Take 1 tablet (4 mg total) by mouth daily as needed., Disp: 14 tablet, Rfl: 0 .  pantoprazole (PROTONIX) 40 MG tablet, Take 1 tablet (40 mg total) by mouth daily., Disp: 90 tablet, Rfl: 0 .  triamcinolone cream (KENALOG) 0.1 %, Apply 1 application topically 2 (two) times daily. (Patient taking differently: Apply 1 application topically 2 (two) times daily as needed. ), Disp: 30 g, Rfl: 0 .  vitamin E 400 UNIT capsule, Take 400 Units by mouth daily., Disp: , Rfl:   Social History   Tobacco Use  Smoking Status Former Smoker  Smokeless Tobacco Never Used    Allergies  Allergen Reactions  . Clarithromycin     REACTION: nausea  . Codeine   . Erythromycin   . Moxifloxacin     REACTION: nausea   Objective:  There were no vitals filed for this  visit. There is no height or weight on file to calculate BMI. Constitutional Well developed. Well nourished.  Vascular Dorsalis pedis pulses palpable bilaterally. Posterior tibial pulses palpable bilaterally. Capillary refill normal to all digits.  No cyanosis or clubbing noted. Pedal hair growth normal.  Neurologic Normal speech. Oriented to person, place, and time. Epicritic sensation to light touch grossly present bilaterally.  Dermatologic Nails well groomed and normal in appearance. No open wounds. No skin lesions.  Orthopedic: Normal joint ROM without pain or crepitus bilaterally. No visible deformities. Tender to palpation at the calcaneal tuber right. No pain with calcaneal squeeze right. Ankle ROM diminished range of motion right. Silfverskiold Test: positive right.   Radiographs: Taken and reviewed. No acute fractures or dislocations. No evidence of stress fracture.  Plantar heel spur present. Posterior heel spur present.   Assessment:   1. Foot pain, right   2. Plantar fasciitis of right foot    Plan:  Patient was evaluated and treated and all questions answered.  Plantar Fasciitis, right - XR reviewed as above.  - Educated on icing and stretching. Instructions given.  - Injection delivered to the plantar fascia as below. - DME: Plantar Fascial Brace - Pharmacologic management: Meloxicam/Medrol Dose Pak. Educated on risks/benefits and proper taking of medication.  Custom orthotics were evaluated by me that patient brought in.  I encouraged her to start wearing them.  They appear to be in good shape and condition.  Procedure: Injection Tendon/Ligament Location: Right plantar fascia at the glabrous junction; medial approach. Skin Prep: alcohol Injectate: 0.5 cc 0.5% marcaine plain, 0.5 cc of 1% Lidocaine, 0.5 cc kenalog 10. Disposition: Patient tolerated procedure well. Injection site dressed with a band-aid.  No follow-ups on file.

## 2020-05-28 ENCOUNTER — Other Ambulatory Visit: Payer: Self-pay

## 2020-05-28 ENCOUNTER — Ambulatory Visit (INDEPENDENT_AMBULATORY_CARE_PROVIDER_SITE_OTHER): Payer: BC Managed Care – PPO | Admitting: Internal Medicine

## 2020-05-28 ENCOUNTER — Encounter: Payer: Self-pay | Admitting: Internal Medicine

## 2020-05-28 VITALS — BP 100/70 | HR 69 | Temp 98.3°F | Ht 66.5 in | Wt 170.0 lb

## 2020-05-28 DIAGNOSIS — J309 Allergic rhinitis, unspecified: Secondary | ICD-10-CM

## 2020-05-28 DIAGNOSIS — J4520 Mild intermittent asthma, uncomplicated: Secondary | ICD-10-CM | POA: Diagnosis not present

## 2020-05-28 DIAGNOSIS — Z1159 Encounter for screening for other viral diseases: Secondary | ICD-10-CM

## 2020-05-28 DIAGNOSIS — J069 Acute upper respiratory infection, unspecified: Secondary | ICD-10-CM | POA: Diagnosis not present

## 2020-05-28 MED ORDER — AZITHROMYCIN 250 MG PO TABS: ORAL_TABLET | ORAL | 1 refills | Status: DC

## 2020-05-28 MED ORDER — PREDNISONE 10 MG PO TABS
ORAL_TABLET | ORAL | 0 refills | Status: DC
Start: 2020-05-28 — End: 2020-11-10

## 2020-05-28 MED FILL — AZITHROMYCIN 250 MG TABS: 250 | 5 days supply | Qty: 6 | Fill #0

## 2020-05-28 MED FILL — predniSONE 10 MG TABS: 10 | 9 days supply | Qty: 18 | Fill #0

## 2020-05-28 NOTE — Patient Instructions (Signed)
Please take all new medication as prescribed - the zpack and prednisone  Please continue all other medications as before, including the zyrtec, nasacort, and mucinex  Please have the pharmacy call with any other refills you may need.  Please continue your efforts at being more active, low cholesterol diet, and weight control  Please keep your appointments with your specialists as you may have planned

## 2020-05-28 NOTE — Progress Notes (Signed)
Subjective:    Patient ID: Andrea Bonilla, female    DOB: 1971/02/05, 49 y.o.   MRN: [id number]  HPI   Here with 2-3 days acute onset fever, facial pain, pressure, headache, general weakness and malaise, and greenish d/c, with mild ST and cough, but pt denies chest pain, wheezing, increased sob or doe, orthopnea, PND, increased LE swelling, palpitations, dizziness or syncope.  Does have several wks ongoing nasal allergy symptoms with clearish congestion, itch and sneezing, without fever, pain, ST, cough, swelling or wheezing.  Gained a few lbs recenlty, but peak wt was about 197 about 4 yrs ago.   Wt Readings from Last 3 Encounters:  05/28/20 170 lb (77.1 kg)  11/08/19 167 lb (75.8 kg)  07/31/19 160 lb (72.6 kg)  Sched for mammogram next wk.   Past Medical History:  Diagnosis Date  . ALLERGIC RHINITIS   . ASTHMA   . Asthma   . HYPERLIPIDEMIA   . IBS (irritable bowel syndrome)   . Kidney stones   . OSTEOPENIA   . Splenic lesion   . STRESS FRACTURE, FOOT    Past Surgical History:  Procedure Laterality Date  . BREAST SURGERY    . skin graft Right forehead      reports that she has quit smoking. She has never used smokeless tobacco. She reports current alcohol use. She reports that she does not use drugs. family history includes Colon polyps in her brother, father, and mother; Coronary artery disease in an other family member; Diabetes in an other family member; Heart disease in her mother; Hyperlipidemia in her mother; Hypertension in her mother and another family member; Stroke in an other family member. Allergies  Allergen Reactions  . Clarithromycin     REACTION: nausea  . Codeine   . Erythromycin   . Moxifloxacin     REACTION: nausea   Current Outpatient Medications on File Prior to Visit  Medication Sig Dispense Refill  . albuterol (PROVENTIL HFA;VENTOLIN HFA) 108 (90 BASE) MCG/ACT inhaler Inhale 2 puffs into the lungs every 6 (six) hours as needed for wheezing or  shortness of breath (or cough). 1 Inhaler 1  . AMBULATORY NON FORMULARY MEDICATION Medication Name: Ultimate Flora Probiotic-30B Live Cultures- 1 capsule daily    . AMBULATORY NON FORMULARY MEDICATION Medication Name: Amberen OTC menopausal Relief-- 2 tablets daily    . APPLE CIDER VINEGAR PO 2 gummys daily    . Biotin 10000 MCG TABS Take 1 tablet by mouth daily.    Marland Kitchen CALCIUM-VITAMIN D PO Take 2 tablets by mouth daily.    . Cholecalciferol (VITAMIN D3) 1000 UNITS CAPS Take 1 capsule by mouth daily.      . Cyanocobalamin (VITAMIN B 12 PO) Take 100 mg by mouth daily.     Marland Kitchen dicyclomine (BENTYL) 10 MG capsule Take 1 capsule by mouth twice daily 60 capsule 0  . dicyclomine (BENTYL) 10 MG capsule Take 1 capsule by mouth twice daily 60 capsule 3  . ferrous sulfate 325 (65 FE) MG tablet Take 325 mg by mouth daily with breakfast.    . fexofenadine (ALLEGRA) 180 MG tablet Take 180 mg by mouth daily.      . Flaxseed, Linseed, 1000 MG CAPS Take 2 capsules by mouth daily.    Marland Kitchen L-Lysine 500 MG TABS Take 2 tablets by mouth daily.    . Multiple Vitamin (MULTIVITAMIN) tablet Take 1 tablet by mouth daily.      . Multiple Vitamins-Minerals (HAIR SKIN & NAILS  ADVANCED PO) Take 1 tablet by mouth daily.    Marland Kitchen NIACIN PO Take 1 tablet by mouth daily.    . norgestimate-ethinyl estradiol (SPRINTEC 28) 0.25-35 MG-MCG tablet Take 1 tablet by mouth daily.    . ondansetron (ZOFRAN) 4 MG tablet Take 1 tablet (4 mg total) by mouth daily as needed. 14 tablet 0  . pantoprazole (PROTONIX) 40 MG tablet Take 1 tablet (40 mg total) by mouth daily. 90 tablet 0  . triamcinolone cream (KENALOG) 0.1 % Apply 1 application topically 2 (two) times daily. (Patient taking differently: Apply 1 application topically 2 (two) times daily as needed. ) 30 g 0  . vitamin E 400 UNIT capsule Take 400 Units by mouth daily.     No current facility-administered medications on file prior to visit.   Review of Systems All otherwise neg per pt      Objective:   Physical Exam BP 100/70 (BP Location: Left Arm, Patient Position: Sitting, Cuff Size: Large)   Pulse 69   Temp 98.3 F (36.8 C) (Oral)   Ht 5' 6.5" (1.689 m)   Wt 170 lb (77.1 kg)   SpO2 98%   BMI 27.03 kg/m  VS noted,  Constitutional: Pt appears in NAD HENT: Head: NCAT.  Right Ear: External ear normal.  Left Ear: External ear normal.  Bilat tm's with mild erythema.  Max sinus areas mild tender.  Pharynx with mild erythema, no exudate Eyes: . Pupils are equal, round, and reactive to light. Conjunctivae and EOM are normal Nose: without d/c or deformity Neck: Neck supple. Gross normal ROM Cardiovascular: Normal rate and regular rhythm.   Pulmonary/Chest: Effort normal and breath sounds without rales or wheezing.  Abd:  Soft, NT, ND, + BS, no organomegaly Neurological: Pt is alert. At baseline orientation, motor grossly intact Skin: Skin is warm. No rashes, other new lesions, no LE edema Psychiatric: Pt behavior is normal without agitation  All otherwise neg per pt Lab Results  Component Value Date   WBC 7.7 07/17/2019   HGB 13.5 07/17/2019   HCT 39.1 07/17/2019   PLT 239 07/17/2019   GLUCOSE 110 (H) 07/17/2019   CHOL 238 (H) 11/08/2014   TRIG 203.0 (H) 11/08/2014   HDL 65.00 11/08/2014   LDLDIRECT 139.2 11/08/2014   ALT 21 07/17/2019   AST 21 07/17/2019   NA 134 (L) 07/17/2019   K 3.7 07/17/2019   CL 101 07/17/2019   CREATININE 0.89 07/17/2019   BUN 9 07/17/2019   CO2 24 07/17/2019   TSH 2.98 11/08/2014   INR 0.89 06/17/2011      Assessment & Plan:

## 2020-06-05 DIAGNOSIS — Z1231 Encounter for screening mammogram for malignant neoplasm of breast: Secondary | ICD-10-CM | POA: Diagnosis not present

## 2020-06-08 ENCOUNTER — Encounter: Payer: Self-pay | Admitting: Internal Medicine

## 2020-06-08 DIAGNOSIS — J069 Acute upper respiratory infection, unspecified: Secondary | ICD-10-CM | POA: Insufficient documentation

## 2020-06-08 NOTE — Assessment & Plan Note (Addendum)
Mild to mod, for antibx course,  to f/u any worsening symptoms or concerns  I spent 31 minutes in preparing to see the patient by review of recent labs, imaging and procedures, obtaining and reviewing separately obtained history, communicating with the patient and family or caregiver, ordering medications, tests or procedures, and documenting clinical information in the EHR including the differential Dx, treatment, and any further evaluation and other management of uri, allergies, asthma

## 2020-06-08 NOTE — Assessment & Plan Note (Signed)
stable overall by history and exam, recent data reviewed with pt, and pt to continue medical treatment as before,  to f/u any worsening symptoms or concerns  

## 2020-06-08 NOTE — Assessment & Plan Note (Addendum)
Mild to mod, for predac asd,  Also mucinex, zyrtec, nasacort, to f/u any worsening symptoms or concerns

## 2020-06-26 ENCOUNTER — Other Ambulatory Visit: Payer: Self-pay | Admitting: Podiatry

## 2020-06-26 DIAGNOSIS — M722 Plantar fascial fibromatosis: Secondary | ICD-10-CM

## 2020-06-27 ENCOUNTER — Ambulatory Visit (INDEPENDENT_AMBULATORY_CARE_PROVIDER_SITE_OTHER): Payer: BC Managed Care – PPO | Admitting: Podiatry

## 2020-06-27 ENCOUNTER — Other Ambulatory Visit: Payer: Self-pay

## 2020-06-27 ENCOUNTER — Encounter: Payer: Self-pay | Admitting: Podiatry

## 2020-06-27 DIAGNOSIS — M722 Plantar fascial fibromatosis: Secondary | ICD-10-CM

## 2020-06-27 DIAGNOSIS — M79671 Pain in right foot: Secondary | ICD-10-CM | POA: Diagnosis not present

## 2020-07-01 ENCOUNTER — Encounter: Payer: Self-pay | Admitting: Podiatry

## 2020-07-01 NOTE — Progress Notes (Signed)
Subjective:  Patient ID: Andrea Bonilla, female    DOB: May 11, 1971,  MRN: [id number]  Chief Complaint  Patient presents with  . Plantar Fasciitis    i am doing better on the right heel and the inserts and medicine does help and the brace i do wear     49 y.o. female presents with the above complaint.  Patient presents with follow-up of right heel pain that has clinically resolved.  Patient states that he was doing much better she has been wearing her orthotics the medicine has helped the brace has helped.  The injection has also helped.  She denies any other acute complaints.   Review of Systems: Negative except as noted in the HPI. Denies N/V/F/Ch.  Past Medical History:  Diagnosis Date  . ALLERGIC RHINITIS   . ASTHMA   . Asthma   . HYPERLIPIDEMIA   . IBS (irritable bowel syndrome)   . Kidney stones   . OSTEOPENIA   . Splenic lesion   . STRESS FRACTURE, FOOT     Current Outpatient Medications:  .  albuterol (PROVENTIL HFA;VENTOLIN HFA) 108 (90 BASE) MCG/ACT inhaler, Inhale 2 puffs into the lungs every 6 (six) hours as needed for wheezing or shortness of breath (or cough)., Disp: 1 Inhaler, Rfl: 1 .  AMBULATORY NON FORMULARY MEDICATION, Medication Name: Ultimate Flora Probiotic-30B Live Cultures- 1 capsule daily, Disp: , Rfl:  .  AMBULATORY NON FORMULARY MEDICATION, Medication Name: Amberen OTC menopausal Relief-- 2 tablets daily, Disp: , Rfl:  .  APPLE CIDER VINEGAR PO, 2 gummys daily, Disp: , Rfl:  .  azithromycin (ZITHROMAX) 250 MG tablet, 2 tab by mouth day 1, then 1 per day, Disp: 6 tablet, Rfl: 1 .  Biotin 10000 MCG TABS, Take 1 tablet by mouth daily., Disp: , Rfl:  .  CALCIUM-VITAMIN D PO, Take 2 tablets by mouth daily., Disp: , Rfl:  .  Cholecalciferol (VITAMIN D3) 1000 UNITS CAPS, Take 1 capsule by mouth daily.  , Disp: , Rfl:  .  Cyanocobalamin (VITAMIN B 12 PO), Take 100 mg by mouth daily. , Disp: , Rfl:  .  dicyclomine (BENTYL) 10 MG capsule, Take 1 capsule by mouth  twice daily, Disp: 60 capsule, Rfl: 0 .  dicyclomine (BENTYL) 10 MG capsule, Take 1 capsule by mouth twice daily, Disp: 60 capsule, Rfl: 3 .  ferrous sulfate 325 (65 FE) MG tablet, Take 325 mg by mouth daily with breakfast., Disp: , Rfl:  .  fexofenadine (ALLEGRA) 180 MG tablet, Take 180 mg by mouth daily.  , Disp: , Rfl:  .  Flaxseed, Linseed, 1000 MG CAPS, Take 2 capsules by mouth daily., Disp: , Rfl:  .  L-Lysine 500 MG TABS, Take 2 tablets by mouth daily., Disp: , Rfl:  .  Multiple Vitamin (MULTIVITAMIN) tablet, Take 1 tablet by mouth daily.  , Disp: , Rfl:  .  Multiple Vitamins-Minerals (HAIR SKIN & NAILS ADVANCED PO), Take 1 tablet by mouth daily., Disp: , Rfl:  .  NIACIN PO, Take 1 tablet by mouth daily., Disp: , Rfl:  .  norgestimate-ethinyl estradiol (SPRINTEC 28) 0.25-35 MG-MCG tablet, Take 1 tablet by mouth daily., Disp: , Rfl:  .  ondansetron (ZOFRAN) 4 MG tablet, Take 1 tablet (4 mg total) by mouth daily as needed., Disp: 14 tablet, Rfl: 0 .  pantoprazole (PROTONIX) 40 MG tablet, Take 1 tablet (40 mg total) by mouth daily., Disp: 90 tablet, Rfl: 0 .  predniSONE (DELTASONE) 10 MG tablet, 3 tabs by  mouth per day for 3 days,2tabs per day for 3 days,1tab per day for 3 days, Disp: 18 tablet, Rfl: 0 .  triamcinolone cream (KENALOG) 0.1 %, Apply 1 application topically 2 (two) times daily. (Patient taking differently: Apply 1 application topically 2 (two) times daily as needed. ), Disp: 30 g, Rfl: 0 .  vitamin E 400 UNIT capsule, Take 400 Units by mouth daily., Disp: , Rfl:   Social History   Tobacco Use  Smoking Status Former Smoker  Smokeless Tobacco Never Used    Allergies  Allergen Reactions  . Clarithromycin     REACTION: nausea  . Codeine   . Erythromycin   . Moxifloxacin     REACTION: nausea   Objective:  There were no vitals filed for this visit. There is no height or weight on file to calculate BMI. Constitutional Well developed. Well nourished.  Vascular Dorsalis  pedis pulses palpable bilaterally. Posterior tibial pulses palpable bilaterally. Capillary refill normal to all digits.  No cyanosis or clubbing noted. Pedal hair growth normal.  Neurologic Normal speech. Oriented to person, place, and time. Epicritic sensation to light touch grossly present bilaterally.  Dermatologic Nails well groomed and normal in appearance. No open wounds. No skin lesions.  Orthopedic: Normal joint ROM without pain or crepitus bilaterally. No visible deformities. Tender to palpation at the calcaneal tuber right. No pain with calcaneal squeeze right. Ankle ROM diminished range of motion right. Silfverskiold Test: positive right.   Radiographs: Taken and reviewed. No acute fractures or dislocations. No evidence of stress fracture.  Plantar heel spur present. Posterior heel spur present.   Assessment:   1. Foot pain, right   2. Plantar fasciitis of right foot    Plan:  Patient was evaluated and treated and all questions answered.  Plantar Fasciitis, right -Clinically the pain is completely resolved.  Patient has started wearing her orthotics which seems to tremendously help.  I discussed with her the long-term management of plantar fasciitis includes doing stretching exercises as wearing custom orthotics and good shoes.  Patient states understanding. -I will see her back if any foot and ankle issues arises in the near future.  Patient states understanding..  No follow-ups on file.

## 2020-08-07 DIAGNOSIS — Z20828 Contact with and (suspected) exposure to other viral communicable diseases: Secondary | ICD-10-CM | POA: Diagnosis not present

## 2020-08-22 ENCOUNTER — Other Ambulatory Visit: Payer: Self-pay | Admitting: Internal Medicine

## 2020-08-22 NOTE — Telephone Encounter (Signed)
Please refill as per office routine med refill policy (all routine meds refilled for 3 mo or monthly per pt preference up to one year from last visit, then month to month grace period for 3 mo, then further med refills will have to be denied)  

## 2020-09-05 DIAGNOSIS — Z20828 Contact with and (suspected) exposure to other viral communicable diseases: Secondary | ICD-10-CM | POA: Diagnosis not present

## 2020-09-05 DIAGNOSIS — R059 Cough, unspecified: Secondary | ICD-10-CM | POA: Diagnosis not present

## 2020-09-06 DIAGNOSIS — R059 Cough, unspecified: Secondary | ICD-10-CM | POA: Diagnosis not present

## 2020-09-06 DIAGNOSIS — Z20828 Contact with and (suspected) exposure to other viral communicable diseases: Secondary | ICD-10-CM | POA: Diagnosis not present

## 2020-09-22 DIAGNOSIS — D229 Melanocytic nevi, unspecified: Secondary | ICD-10-CM | POA: Diagnosis not present

## 2020-09-22 DIAGNOSIS — L819 Disorder of pigmentation, unspecified: Secondary | ICD-10-CM | POA: Diagnosis not present

## 2020-09-22 DIAGNOSIS — L821 Other seborrheic keratosis: Secondary | ICD-10-CM | POA: Diagnosis not present

## 2020-09-22 DIAGNOSIS — L814 Other melanin hyperpigmentation: Secondary | ICD-10-CM | POA: Diagnosis not present

## 2020-10-15 DIAGNOSIS — Z13228 Encounter for screening for other metabolic disorders: Secondary | ICD-10-CM | POA: Diagnosis not present

## 2020-10-15 DIAGNOSIS — E785 Hyperlipidemia, unspecified: Secondary | ICD-10-CM | POA: Diagnosis not present

## 2020-10-15 DIAGNOSIS — Z1321 Encounter for screening for nutritional disorder: Secondary | ICD-10-CM | POA: Diagnosis not present

## 2020-10-15 DIAGNOSIS — Z139 Encounter for screening, unspecified: Secondary | ICD-10-CM | POA: Diagnosis not present

## 2020-10-15 DIAGNOSIS — Z130 Encounter for screening for diseases of the blood and blood-forming organs and certain disorders involving the immune mechanism: Secondary | ICD-10-CM | POA: Diagnosis not present

## 2020-10-15 DIAGNOSIS — Z1329 Encounter for screening for other suspected endocrine disorder: Secondary | ICD-10-CM | POA: Diagnosis not present

## 2020-10-20 DIAGNOSIS — Z6828 Body mass index (BMI) 28.0-28.9, adult: Secondary | ICD-10-CM | POA: Diagnosis not present

## 2020-10-20 DIAGNOSIS — Z01419 Encounter for gynecological examination (general) (routine) without abnormal findings: Secondary | ICD-10-CM | POA: Diagnosis not present

## 2020-11-10 ENCOUNTER — Ambulatory Visit (INDEPENDENT_AMBULATORY_CARE_PROVIDER_SITE_OTHER): Payer: BC Managed Care – PPO | Admitting: Internal Medicine

## 2020-11-10 ENCOUNTER — Encounter: Payer: Self-pay | Admitting: Internal Medicine

## 2020-11-10 ENCOUNTER — Other Ambulatory Visit: Payer: Self-pay

## 2020-11-10 VITALS — BP 130/80 | HR 88 | Temp 98.6°F | Ht 66.5 in | Wt 182.6 lb

## 2020-11-10 DIAGNOSIS — Z0000 Encounter for general adult medical examination without abnormal findings: Secondary | ICD-10-CM

## 2020-11-10 DIAGNOSIS — H9191 Unspecified hearing loss, right ear: Secondary | ICD-10-CM | POA: Diagnosis not present

## 2020-11-10 DIAGNOSIS — Z1159 Encounter for screening for other viral diseases: Secondary | ICD-10-CM | POA: Diagnosis not present

## 2020-11-10 DIAGNOSIS — J4520 Mild intermittent asthma, uncomplicated: Secondary | ICD-10-CM | POA: Diagnosis not present

## 2020-11-10 DIAGNOSIS — E7849 Other hyperlipidemia: Secondary | ICD-10-CM | POA: Diagnosis not present

## 2020-11-10 DIAGNOSIS — Z0001 Encounter for general adult medical examination with abnormal findings: Secondary | ICD-10-CM

## 2020-11-10 NOTE — Progress Notes (Addendum)
Subjective:    Patient ID: Andrea Bonilla, female    DOB: June 22, 1971, 49 y.o.   MRN: [id number]  HPI  Here for wellness and f/u;  Overall doing ok;  Pt denies Chest pain, worsening SOB, DOE, wheezing, orthopnea, PND, worsening LE edema, palpitations, dizziness or syncope.  Pt denies neurological change such as new headache, facial or extremity weakness.  Pt denies polydipsia, polyuria, or low sugar symptoms. Pt states overall good compliance with treatment and medications, good tolerability, and has been trying to follow appropriate diet.  Pt denies worsening depressive symptoms, suicidal ideation or panic. No fever, night sweats, wt loss, loss of appetite, or other constitutional symptoms.  Pt states good ability with ADL's, has low fall risk, home safety reviewed and adequate, no other significant changes in vision, and only occasionally active with exercise. Gained wt with being less active.  Wt Readings from Last 3 Encounters:  11/10/20 182 lb 9.6 oz (82.8 kg)  05/28/20 170 lb (77.1 kg)  11/08/19 167 lb (75.8 kg)  Also c/o worsening right hearing loss for uncleawr reason in the past year.   Past Medical History:  Diagnosis Date  . ALLERGIC RHINITIS   . ASTHMA   . Asthma   . HYPERLIPIDEMIA   . IBS (irritable bowel syndrome)   . Kidney stones   . OSTEOPENIA   . Splenic lesion   . STRESS FRACTURE, FOOT    Past Surgical History:  Procedure Laterality Date  . BREAST SURGERY    . skin graft Right forehead      reports that she has quit smoking. She has never used smokeless tobacco. She reports current alcohol use. She reports that she does not use drugs. family history includes Colon polyps in her brother, father, and mother; Coronary artery disease in an other family member; Diabetes in an other family member; Heart disease in her mother; Hyperlipidemia in her mother; Hypertension in her mother and another family member; Stroke in an other family member. Allergies  Allergen Reactions   . Clarithromycin     REACTION: nausea  . Codeine   . Erythromycin   . Moxifloxacin     REACTION: nausea   Current Outpatient Medications on File Prior to Visit  Medication Sig Dispense Refill  . AMBULATORY NON FORMULARY MEDICATION Medication Name: Ultimate Flora Probiotic-30B Live Cultures- 1 capsule daily    . AMBULATORY NON FORMULARY MEDICATION Medication Name: Amberen OTC menopausal Relief-- 2 tablets daily    . Biotin 10000 MCG TABS Take 1 tablet by mouth daily.    Marland Kitchen CALCIUM-VITAMIN D PO Take 2 tablets by mouth daily.    . Cholecalciferol (VITAMIN D3) 1000 UNITS CAPS Take 1 capsule by mouth daily.      . Cyanocobalamin (VITAMIN B 12 PO) Take 100 mg by mouth daily.     . ferrous sulfate 325 (65 FE) MG tablet Take 325 mg by mouth daily with breakfast.    . Flaxseed, Linseed, 1000 MG CAPS Take 2 capsules by mouth daily.    Marland Kitchen L-Lysine 500 MG TABS Take 2 tablets by mouth daily.    . Multiple Vitamin (MULTIVITAMIN) tablet Take 1 tablet by mouth daily.      . Multiple Vitamins-Minerals (HAIR SKIN & NAILS ADVANCED PO) Take 1 tablet by mouth daily.    Marland Kitchen NIACIN PO Take 1 tablet by mouth daily.    . norgestimate-ethinyl estradiol (SPRINTEC 28) 0.25-35 MG-MCG tablet Take 1 tablet by mouth daily.    . vitamin E 400  UNIT capsule Take 400 Units by mouth daily.     No current facility-administered medications on file prior to visit.   Review of Systems All otherwise neg per pt    Objective:   Physical Exam BP 130/80 (BP Location: Left Arm, Patient Position: Sitting, Cuff Size: Large)   Pulse 88   Temp 98.6 F (37 C) (Oral)   Ht 5' 6.5" (1.689 m)   Wt 182 lb 9.6 oz (82.8 kg)   SpO2 97%   BMI 29.03 kg/m  VS noted,  Constitutional: Pt appears in NAD HENT: Head: NCAT.  Right Ear: External ear normal.  Left Ear: External ear normal.  Eyes: . Pupils are equal, round, and reactive to light. Conjunctivae and EOM are normal Nose: without d/c or deformity Neck: Neck supple. Gross normal  ROM Cardiovascular: Normal rate and regular rhythm.   Pulmonary/Chest: Effort normal and breath sounds without rales or wheezing.  Abd:  Soft, NT, ND, + BS, no organomegaly Neurological: Pt is alert. At baseline orientation, motor grossly intact Skin: Skin is warm. No rashes, other new lesions, no LE edema Psychiatric: Pt behavior is normal without agitation  All otherwise neg per pt Lab Results  Component Value Date   WBC 7.7 07/17/2019   HGB 13.5 07/17/2019   HCT 39.1 07/17/2019   PLT 239 07/17/2019   GLUCOSE 110 (H) 07/17/2019   CHOL 238 (H) 11/08/2014   TRIG 203.0 (H) 11/08/2014   HDL 65.00 11/08/2014   LDLDIRECT 139.2 11/08/2014   ALT 21 07/17/2019   AST 21 07/17/2019   NA 134 (L) 07/17/2019   K 3.7 07/17/2019   CL 101 07/17/2019   CREATININE 0.89 07/17/2019   BUN 9 07/17/2019   CO2 24 07/17/2019   TSH 2.98 11/08/2014   INR 0.89 06/17/2011      Assessment & Plan:

## 2020-11-10 NOTE — Patient Instructions (Addendum)
You will be contacted regarding the referral for: ENT  Please continue all other medications as before, and refills have been done if requested.  Please have the pharmacy call with any other refills you may need.  Please continue your efforts at being more active, low cholesterol diet, and weight control.  You are otherwise up to date with prevention measures today.  Please keep your appointments with your specialists as you may have planned  Please make an Appointment to return for your 1 year visit, or sooner if needed, with Lab testing by Appointment as well, to be done about 3-5 days before at the [facility] (so this is for TWO appointments - please see the scheduling desk as you leave)  Due to the ongoing Covid 19 pandemic, our lab now requires an appointment for any labs done at our office.  If you need labs done and do not have an appointment, please call our office ahead of time to schedule before presenting to the lab for your testing.

## 2020-11-13 ENCOUNTER — Other Ambulatory Visit: Payer: Self-pay | Admitting: Gastroenterology

## 2020-11-13 ENCOUNTER — Other Ambulatory Visit: Payer: Self-pay | Admitting: Internal Medicine

## 2020-11-13 NOTE — Telephone Encounter (Signed)
Last visit Aug 2020  Dicyclomine can be refilled for a months but needs clinic appointment with me during then.  - HD

## 2020-11-15 ENCOUNTER — Encounter: Payer: Self-pay | Admitting: Internal Medicine

## 2020-11-15 DIAGNOSIS — H9191 Unspecified hearing loss, right ear: Secondary | ICD-10-CM | POA: Insufficient documentation

## 2020-11-15 NOTE — Assessment & Plan Note (Signed)
stable overall by history and exam, recent data reviewed with pt, and pt to continue medical treatment as before,  to f/u any worsening symptoms or concerns  

## 2020-11-15 NOTE — Assessment & Plan Note (Addendum)
Exam benign, worsening over the past yr, for ENT referral  I spent 21 minutes in addition to time for CPX wellness examination in preparing to see the patient by review of recent labs, imaging and procedures, obtaining and reviewing separately obtained history, communicating with the patient and family or caregiver, ordering medications, tests or procedures, and documenting clinical information in the EHR including the differential Dx, treatment, and any further evaluation and other management of right hearing loss, hld, asthma

## 2020-11-15 NOTE — Assessment & Plan Note (Signed)

## 2020-12-11 DIAGNOSIS — N959 Unspecified menopausal and perimenopausal disorder: Secondary | ICD-10-CM | POA: Diagnosis not present

## 2020-12-11 DIAGNOSIS — N951 Menopausal and female climacteric states: Secondary | ICD-10-CM | POA: Diagnosis not present

## 2021-01-13 ENCOUNTER — Ambulatory Visit: Payer: BC Managed Care – PPO | Admitting: Gastroenterology

## 2021-03-16 ENCOUNTER — Other Ambulatory Visit (HOSPITAL_COMMUNITY): Payer: Self-pay

## 2021-03-17 ENCOUNTER — Other Ambulatory Visit (HOSPITAL_COMMUNITY): Payer: Self-pay

## 2021-03-17 MED ORDER — NORGESTIMATE-ETH ESTRADIOL 0.25-35 MG-MCG PO TABS
1.0000 | ORAL_TABLET | Freq: Every day | ORAL | 0 refills | Status: DC
  Filled 2021-03-17: qty 84, 84d supply, fill #0

## 2021-03-19 ENCOUNTER — Other Ambulatory Visit (HOSPITAL_COMMUNITY): Payer: Self-pay

## 2021-03-24 ENCOUNTER — Other Ambulatory Visit (HOSPITAL_COMMUNITY): Payer: Self-pay

## 2021-03-30 ENCOUNTER — Encounter: Payer: Self-pay | Admitting: Gastroenterology

## 2021-03-30 ENCOUNTER — Other Ambulatory Visit (HOSPITAL_COMMUNITY): Payer: Self-pay

## 2021-03-30 ENCOUNTER — Ambulatory Visit (INDEPENDENT_AMBULATORY_CARE_PROVIDER_SITE_OTHER): Payer: PRIVATE HEALTH INSURANCE | Admitting: Gastroenterology

## 2021-03-30 VITALS — BP 106/70 | HR 80 | Ht 66.5 in | Wt 176.8 lb

## 2021-03-30 DIAGNOSIS — K580 Irritable bowel syndrome with diarrhea: Secondary | ICD-10-CM | POA: Diagnosis not present

## 2021-03-30 DIAGNOSIS — R12 Heartburn: Secondary | ICD-10-CM | POA: Diagnosis not present

## 2021-03-30 MED ORDER — PANTOPRAZOLE SODIUM 40 MG PO TBEC
1.0000 | DELAYED_RELEASE_TABLET | Freq: Every day | ORAL | 0 refills | Status: DC
  Filled 2021-03-30: qty 90, 90d supply, fill #0

## 2021-03-30 MED ORDER — DICYCLOMINE HCL 10 MG PO CAPS
10.0000 mg | ORAL_CAPSULE | Freq: Two times a day (BID) | ORAL | 6 refills | Status: DC
  Filled 2021-03-30: qty 60, 30d supply, fill #0

## 2021-03-30 NOTE — Progress Notes (Signed)
[facility] Progress Note  Chief Complaint: Lower abdominal pain  Subjective  History: Andrea Bonilla was seen in August 2020 for episodic lower abdominal pain and diarrhea.  Colonoscopy at that time showed 2 sessile serrated polyps, one greater than a centimeter.  (3-year recall recommended). Symptoms controlled with as needed use of dicyclomine.  She also has intermittent heartburn for which she takes pantoprazole.  Andrea Bonilla is glad reports she is doing well taking dicyclomine once every evening.  While she would rather not take long-term medicines if possible, she was afraid to stop it because she recalls how poorly she felt before beginning this medicine.  Bowel habits are regular and she denies rectal bleeding.  For the last couple of years she has been on pantoprazole 40 mg every morning, started by primary care for frequent heartburn.  She has rare breakthrough symptoms with it and denies dysphagia or odynophagia.  ROS: Cardiovascular:  no chest pain Respiratory: no dyspnea  The patient's Past Medical, Family and Social History were reviewed and are on file in the EMR.  Objective:  Med list reviewed  Current Outpatient Medications:  .  AMBULATORY NON FORMULARY MEDICATION, Medication Name: Ultimate Flora Probiotic-30B Live Cultures- 1 capsule daily, Disp: , Rfl:  .  AMBULATORY NON FORMULARY MEDICATION, Medication Name: Amberen OTC menopausal Relief-- 2 tablets daily, Disp: , Rfl:  .  Biotin 10000 MCG TABS, Take 1 tablet by mouth daily., Disp: , Rfl:  .  CALCIUM-VITAMIN D PO, Take 2 tablets by mouth daily., Disp: , Rfl:  .  Cholecalciferol (VITAMIN D3) 1000 UNITS CAPS, Take 1 capsule by mouth daily., Disp: , Rfl:  .  Cyanocobalamin (VITAMIN B 12 PO), Take 100 mg by mouth daily., Disp: , Rfl:  .  dicyclomine (BENTYL) 10 MG capsule, Take 1 capsule by mouth twice daily, Disp: 60 capsule, Rfl: 0 .  ferrous sulfate 325 (65 FE) MG tablet, Take 325 mg by mouth daily with breakfast., Disp: ,  Rfl:  .  Flaxseed, Linseed, 1000 MG CAPS, Take 2 capsules by mouth daily., Disp: , Rfl:  .  L-Lysine 500 MG TABS, Take 2 tablets by mouth daily., Disp: , Rfl:  .  Multiple Vitamin (MULTIVITAMIN) tablet, Take 1 tablet by mouth daily., Disp: , Rfl:  .  Multiple Vitamins-Minerals (HAIR SKIN & NAILS ADVANCED PO), Take 1 tablet by mouth daily., Disp: , Rfl:  .  NIACIN PO, Take 1 tablet by mouth daily., Disp: , Rfl:  .  norgestimate-ethinyl estradiol (ORTHO-CYCLEN) 0.25-35 MG-MCG tablet, Take 1 tablet by mouth daily., Disp: , Rfl:  .  norgestimate-ethinyl estradiol (ORTHO-CYCLEN) 0.25-35 MG-MCG tablet, Take 1 tablet by mouth once daily, Disp: 84 tablet, Rfl: 0 .  pantoprazole (PROTONIX) 40 MG tablet, Take 1 tablet by mouth once daily, Disp: 90 tablet, Rfl: 0 .  vitamin E 400 UNIT capsule, Take 400 Units by mouth daily., Disp: , Rfl:    Vital signs in last 24 hrs: Vitals:   03/30/21 1550  BP: 106/70  Pulse: 80   Wt Readings from Last 3 Encounters:  03/30/21 176 lb 12.8 oz (80.2 kg)  11/10/20 182 lb 9.6 oz (82.8 kg)  05/28/20 170 lb (77.1 kg)    Physical Exam  Well-appearing  HEENT: sclera anicteric, oral mucosa moist without lesions  Neck: supple, no thyromegaly, JVD or lymphadenopathy  Cardiac: RRR without murmurs, S1S2 heard, no peripheral edema  Pulm: clear to auscultation bilaterally, normal RR and effort noted  Abdomen: soft, no tenderness, with active bowel  sounds. No guarding or palpable hepatosplenomegaly.  Labs:   ___________________________________________ Radiologic studies:   ____________________________________________ Other:   _____________________________________________ Assessment & Plan  Assessment: Encounter Diagnoses  Name Primary?  . Irritable bowel syndrome with diarrhea Yes  . Heartburn    Episodic lower abdominal pain with diarrhea that was most consistent with a functional bowel disorder/IBS.  Under good control on low-dose dicyclomine.  I  recommended she cut back to every other day for the next 8 to 10 days and then stop it and resume if necessary. In a similar fashion, I would like her to cut down Protonix to once every other day with the goal of getting down to lowest effective dose and frequency to maintain symptom control.  If she can get off it altogether so much the better.  If she still needs this medicine on a regular basis to control heartburn when she is next due for colonoscopy around August 2023, then I would want her to have an upper endoscopy at that time as well to see if she has developed Barrett's esophagus.  Medicines were refilled, she will otherwise see me in a year or sooner if needed.   20 minutes were spent on this encounter (including chart review, history/exam, counseling/coordination of care, and documentation) > 50% of that time was spent on counseling and coordination of care.  Topics discussed included: We discussed the limitation of acid suppression therapy, breadth of diet and lifestyle changes required for reflux control, the possibility of reflux related complications such as esophagitis, stricture or Barrett's esophagus.  Other anatomic considerations such as gastric outlet obstruction or hiatal hernia may contribute to reflux. I have reviewed the indications, risks, and benefits of PPI therapy with the patient today. I have discussed studies that raise ? of increased osteoporosis, and kidney failure and explained that these studies show very weak associations of unclear significance and not clear cause and effect. We did discuss the potential for vitamin malabsorption, to include magnesium (very rare), calcium (easily modifiable with Calcium Citrate supplement), vitamin B12 (again, correctable with oral B12 supplement), and iron (although rarely clinically significant outside patients who require iron supplementation previously), and can monitor each of these periodically with routine labs. We have agreed to  continue PPI treatment in this case.   Nelida Meuse III

## 2021-03-30 NOTE — Patient Instructions (Signed)
If you are age 50 or older, your body mass index should be between 23-30. Your Body mass index is 28.11 kg/m. If this is out of the aforementioned range listed, please consider follow up with your Primary Care Provider.  If you are age 68 or younger, your body mass index should be between 19-25. Your Body mass index is 28.11 kg/m. If this is out of the aformentioned range listed, please consider follow up with your Primary Care Provider.   It was a pleasure to see you today!  Dr. Loletha Carrow

## 2021-03-31 ENCOUNTER — Other Ambulatory Visit (HOSPITAL_COMMUNITY): Payer: Self-pay

## 2021-06-18 ENCOUNTER — Other Ambulatory Visit: Payer: Self-pay | Admitting: Obstetrics and Gynecology

## 2021-06-18 DIAGNOSIS — R928 Other abnormal and inconclusive findings on diagnostic imaging of breast: Secondary | ICD-10-CM

## 2021-06-22 ENCOUNTER — Other Ambulatory Visit: Payer: Self-pay

## 2021-06-22 MED ORDER — DICYCLOMINE HCL 10 MG PO CAPS: 10.0000 mg | ORAL_CAPSULE | Freq: Two times a day (BID) | ORAL | 6 refills | Status: AC

## 2021-06-22 MED ORDER — PANTOPRAZOLE SODIUM 40 MG PO TBEC: 40.0000 mg | DELAYED_RELEASE_TABLET | Freq: Every day | ORAL | 0 refills | Status: DC

## 2021-06-27 ENCOUNTER — Ambulatory Visit
Admission: RE | Admit: 2021-06-27 | Discharge: 2021-06-27 | Disposition: A | Discharge status: Home / Self Care | Payer: PRIVATE HEALTH INSURANCE | Source: Ambulatory Visit | Attending: Obstetrics and Gynecology | Admitting: Obstetrics and Gynecology

## 2021-06-27 ENCOUNTER — Other Ambulatory Visit: Payer: Self-pay

## 2021-06-27 DIAGNOSIS — R928 Other abnormal and inconclusive findings on diagnostic imaging of breast: Secondary | ICD-10-CM

## 2021-07-01 ENCOUNTER — Other Ambulatory Visit: Payer: PRIVATE HEALTH INSURANCE

## 2021-07-02 ENCOUNTER — Other Ambulatory Visit: Payer: Self-pay

## 2021-07-02 ENCOUNTER — Ambulatory Visit
Admission: EM | Admit: 2021-07-02 | Discharge: 2021-07-02 | Disposition: A | Discharge status: Home / Self Care | Payer: No Typology Code available for payment source | Attending: Urgent Care | Admitting: Urgent Care

## 2021-07-02 DIAGNOSIS — J988 Other specified respiratory disorders: Secondary | ICD-10-CM | POA: Diagnosis not present

## 2021-07-02 DIAGNOSIS — B9789 Other viral agents as the cause of diseases classified elsewhere: Secondary | ICD-10-CM

## 2021-07-02 MED ORDER — PROMETHAZINE-DM 6.25-15 MG/5ML PO SYRP: 5.0000 mL | ORAL_SOLUTION | Freq: Every evening | ORAL | 0 refills | Status: DC | PRN

## 2021-07-02 MED ORDER — PSEUDOEPHEDRINE HCL 60 MG PO TABS: 60.0000 mg | ORAL_TABLET | Freq: Three times a day (TID) | ORAL | 0 refills | Status: DC | PRN

## 2021-07-02 MED ORDER — BENZONATATE 100 MG PO CAPS: 100.0000 mg | ORAL_CAPSULE | Freq: Three times a day (TID) | ORAL | 0 refills | Status: DC | PRN

## 2021-07-02 MED ORDER — CETIRIZINE HCL 10 MG PO TABS: 10.0000 mg | ORAL_TABLET | Freq: Every day | ORAL | 0 refills | Status: AC

## 2021-07-02 NOTE — ED Provider Notes (Signed)
[facility]   MRN: [id number] DOB: 03-May-1971  Subjective:   Andrea Bonilla is a 50 y.o. female presenting for 1 day history of acute onset sinus pressure, bilateral ear pressure, sinus congestion and a dry cough.  Denies chest pain, shortness of breath, wheezing.  Patient does not want to be tested for COVID-19.  She has a history of allergic rhinitis, asthma but has not needed medications for this.  No current facility-administered medications for this encounter.  Current Outpatient Medications:    AMBULATORY NON FORMULARY MEDICATION, Medication Name: Ultimate Flora Probiotic-30B Live Cultures- 1 capsule daily, Disp: , Rfl:    AMBULATORY NON FORMULARY MEDICATION, Medication Name: Amberen OTC menopausal Relief-- 2 tablets daily, Disp: , Rfl:    Biotin 10000 MCG TABS, Take 1 tablet by mouth daily., Disp: , Rfl:    CALCIUM-VITAMIN D PO, Take 2 tablets by mouth daily., Disp: , Rfl:    Cholecalciferol (VITAMIN D3) 1000 UNITS CAPS, Take 1 capsule by mouth daily., Disp: , Rfl:    Cyanocobalamin (VITAMIN B 12 PO), Take 100 mg by mouth daily., Disp: , Rfl:    dicyclomine (BENTYL) 10 MG capsule, Take 1 capsule (10 mg total) by mouth 2 (two) times daily., Disp: 60 capsule, Rfl: 6   ferrous sulfate 325 (65 FE) MG tablet, Take 325 mg by mouth daily with breakfast., Disp: , Rfl:    Flaxseed, Linseed, 1000 MG CAPS, Take 2 capsules by mouth daily., Disp: , Rfl:    L-Lysine 500 MG TABS, Take 2 tablets by mouth daily., Disp: , Rfl:    Multiple Vitamin (MULTIVITAMIN) tablet, Take 1 tablet by mouth daily., Disp: , Rfl:    Multiple Vitamins-Minerals (HAIR SKIN & NAILS ADVANCED PO), Take 1 tablet by mouth daily., Disp: , Rfl:    NIACIN PO, Take 1 tablet by mouth daily., Disp: , Rfl:    norgestimate-ethinyl estradiol (ORTHO-CYCLEN) 0.25-35 MG-MCG tablet, Take 1 tablet by mouth daily., Disp: , Rfl:    norgestimate-ethinyl estradiol (ORTHO-CYCLEN) 0.25-35 MG-MCG tablet, Take 1 tablet by mouth once  daily, Disp: 84 tablet, Rfl: 0   pantoprazole (PROTONIX) 40 MG tablet, Take 1 tablet (40 mg total) by mouth daily., Disp: 90 tablet, Rfl: 0   vitamin E 400 UNIT capsule, Take 400 Units by mouth daily., Disp: , Rfl:    Allergies  Allergen Reactions   Clarithromycin     REACTION: nausea   Codeine    Erythromycin    Moxifloxacin     REACTION: nausea    Past Medical History:  Diagnosis Date   ALLERGIC RHINITIS    ASTHMA    Asthma    HYPERLIPIDEMIA    IBS (irritable bowel syndrome)    Kidney stones    OSTEOPENIA    Splenic lesion    STRESS FRACTURE, FOOT      Past Surgical History:  Procedure Laterality Date   BREAST SURGERY     skin graft Right forehead      Family History  Problem Relation Age of Onset   Coronary artery disease Other        1st degree relative female <50   Diabetes Other    Hypertension Other    Stroke Other    Hypertension Mother    Heart disease Mother    Hyperlipidemia Mother    Colon polyps Mother    Colon polyps Father    Colon polyps Brother    Esophageal cancer Neg Hx    Stomach cancer Neg Hx  Liver disease Neg Hx    Pancreatic cancer Neg Hx     Social History   Tobacco Use   Smoking status: Former   Smokeless tobacco: Never  Substance Use Topics   Alcohol use: Yes    Comment: rare   Drug use: No    ROS   Objective:   Vitals: BP (!) 146/72 (BP Location: Left Arm)   Pulse 63   Temp 98 F (36.7 C) (Oral)   Resp 20   SpO2 98%   Physical Exam Constitutional:      General: She is not in acute distress.    Appearance: Normal appearance. She is well-developed. She is not ill-appearing, toxic-appearing or diaphoretic.  HENT:     Head: Normocephalic and atraumatic.     Nose: Nose normal.     Mouth/Throat:     Mouth: Mucous membranes are moist.  Eyes:     General: No scleral icterus.       Right eye: No discharge.        Left eye: No discharge.     Extraocular Movements: Extraocular movements intact.      Conjunctiva/sclera: Conjunctivae normal.     Pupils: Pupils are equal, round, and reactive to light.  Cardiovascular:     Rate and Rhythm: Normal rate and regular rhythm.     Pulses: Normal pulses.     Heart sounds: Normal heart sounds. No murmur heard.   No friction rub. No gallop.  Pulmonary:     Effort: Pulmonary effort is normal. No respiratory distress.     Breath sounds: Normal breath sounds. No stridor. No wheezing, rhonchi or rales.  Skin:    General: Skin is warm and dry.     Findings: No rash.  Neurological:     Mental Status: She is alert and oriented to person, place, and time.  Psychiatric:        Mood and Affect: Mood normal.        Behavior: Behavior normal.        Thought Content: Thought content normal.        Judgment: Judgment normal.    Assessment and Plan :   PDMP not reviewed this encounter.  1. Viral respiratory illness     Patient refused COVID-19 testing. Suspect viral URI, viral syndrome; physical exam findings reassuring and vital signs stable for discharge. Advised supportive care, offered symptomatic relief. Counseled patient on potential for adverse effects with medications prescribed/recommended today, ER and return-to-clinic precautions discussed, patient verbalized understanding.     Jaynee Eagles, PA-C 07/02/21 1228

## 2021-07-02 NOTE — Discharge Instructions (Addendum)
We will manage this as a viral illness. For sore throat or cough try using a honey-based tea. Use 3 teaspoons of honey with juice squeezed from half lemon. Place shaved pieces of ginger into 1/2-1 cup of water and warm over stove top. Then mix the ingredients and repeat every 4 hours as needed. Please take ibuprofen '600mg'$  every 6 hours with food alternating with OR taken together with Tylenol '500mg'$ -'650mg'$  every 6 hours for throat pain, fevers, aches and pains. Hydrate very well with at least 2 liters of water. Eat light meals such as soups (chicken and noodles, vegetable, chicken and wild rice).  Do not eat foods that you are allergic to.  Taking an antihistamine like Zyrtec, Allegra or Claritin can help against postnasal drainage, sinus congestion.  You can take this together with pseudoephedrine (Sudafed) at a dose of 60 mg 3 times a day or twice daily as needed for the same kind of nasal drip, congestion.  However, limit your use of pseudoephedrine if you have high blood pressure or avoid altogether if you have abnormal heart rhythms, heart condition.

## 2021-07-02 NOTE — ED Triage Notes (Signed)
Pt c/o sinus pressure, bilateral ear pressure, nasal congestion, and a dry cough since yesterday. States neg home covid test.

## 2021-08-07 ENCOUNTER — Other Ambulatory Visit: Payer: Self-pay

## 2021-08-13 ENCOUNTER — Other Ambulatory Visit (HOSPITAL_COMMUNITY): Payer: Self-pay

## 2021-08-13 MED ORDER — ESCITALOPRAM OXALATE 10 MG PO TABS
10.0000 mg | ORAL_TABLET | Freq: Every day | ORAL | 6 refills | Status: AC
  Filled 2021-08-13: qty 30, 30d supply, fill #0
  Filled 2022-07-08: qty 30, 30d supply, fill #1

## 2021-09-01 ENCOUNTER — Other Ambulatory Visit: Payer: Self-pay | Admitting: Gastroenterology

## 2021-09-09 ENCOUNTER — Ambulatory Visit (INDEPENDENT_AMBULATORY_CARE_PROVIDER_SITE_OTHER): Payer: No Typology Code available for payment source | Admitting: Nurse Practitioner

## 2021-09-09 ENCOUNTER — Encounter: Payer: Self-pay | Admitting: Nurse Practitioner

## 2021-09-09 ENCOUNTER — Other Ambulatory Visit (HOSPITAL_COMMUNITY): Payer: Self-pay

## 2021-09-09 VITALS — HR 55 | Temp 100.2°F

## 2021-09-09 DIAGNOSIS — U071 COVID-19: Secondary | ICD-10-CM

## 2021-09-09 MED ORDER — MOLNUPIRAVIR EUA 200MG CAPSULE
4.0000 | ORAL_CAPSULE | Freq: Two times a day (BID) | ORAL | 0 refills | Status: AC
  Filled 2021-09-09: qty 40, 5d supply, fill #0

## 2021-09-09 NOTE — Progress Notes (Signed)
Patient ID: Andrea Bonilla, female    DOB: [birth year], 50 y.o.   MRN: [id number]  Virtual visit completed through [place], a video enabled telemedicine application. Due to national recommendations of social distancing due to COVID-19, a virtual visit is felt to be most appropriate for this patient at this time. Reviewed limitations, risks, security and privacy concerns of performing a virtual visit and the availability of in person appointments. I also reviewed that there may be a patient responsible charge related to this service. The patient agreed to proceed.   Patient location: home Provider location: [place] [facility], office Persons participating in this virtual visit: patient, provider   If any vitals were documented, they were collected by patient at home unless specified below.    Pulse (!) 55 Comment: per patient  Temp 100.2 F (37.9 C) Comment: per patient   CC: Covid 19 Infection  Subjective:   HPI: Andrea Bonilla is a 50 y.o. female presenting on 09/09/2021 for Covid Positive (On 09/06/21. Sx started on 09/06/21-Cough, fever, sore throat, ear pain, body aches, nasal congestion, chest rattling, headache, runny nose, post nasal drip.)    Symptoms started 09/06/2021 At home test 09/06/2021 was positive  Pfizer vaccine x 2 and a booster Dyquill and nyquill helped a little.     Relevant past medical, surgical, family and social history reviewed and updated as indicated. Interim medical history since our last visit reviewed. Allergies and medications reviewed and updated. Outpatient Medications Prior to Visit  Medication Sig Dispense Refill   AMBULATORY NON FORMULARY MEDICATION Medication Name: Ultimate Flora Probiotic-30B Live Cultures- 1 capsule daily     AMBULATORY NON FORMULARY MEDICATION Medication Name: Amberen OTC menopausal Relief-- 2 tablets daily     Biotin 10000 MCG TABS Take 1 tablet by mouth daily.     CALCIUM-VITAMIN D PO Take 2 tablets by mouth  daily.     cetirizine (ZYRTEC ALLERGY) 10 MG tablet Take 1 tablet (10 mg total) by mouth daily. 30 tablet 0   Cholecalciferol (VITAMIN D3) 1000 UNITS CAPS Take 1 capsule by mouth daily.     Cyanocobalamin (VITAMIN B 12 PO) Take 100 mg by mouth daily.     dicyclomine (BENTYL) 10 MG capsule Take 1 capsule (10 mg total) by mouth 2 (two) times daily. 60 capsule 6   escitalopram (LEXAPRO) 10 MG tablet Take 1 tablet (10 mg total) by mouth daily. 30 tablet 6   ferrous sulfate 325 (65 FE) MG tablet Take 325 mg by mouth daily with breakfast.     Flaxseed, Linseed, 1000 MG CAPS Take 2 capsules by mouth daily.     L-Lysine 500 MG TABS Take 2 tablets by mouth daily.     Multiple Vitamin (MULTIVITAMIN) tablet Take 1 tablet by mouth daily.     Multiple Vitamins-Minerals (HAIR SKIN & NAILS ADVANCED PO) Take 1 tablet by mouth daily.     NIACIN PO Take 1 tablet by mouth daily.     pantoprazole (PROTONIX) 40 MG tablet TAKE 1 TABLET BY MOUTH  DAILY 90 tablet 2   vitamin E 400 UNIT capsule Take 400 Units by mouth daily.     benzonatate (TESSALON) 100 MG capsule Take 1-2 capsules (100-200 mg total) by mouth 3 (three) times daily as needed. 60 capsule 0   norgestimate-ethinyl estradiol (ORTHO-CYCLEN) 0.25-35 MG-MCG tablet Take 1 tablet by mouth daily.     norgestimate-ethinyl estradiol (ORTHO-CYCLEN) 0.25-35 MG-MCG tablet Take 1 tablet by mouth once daily 84 tablet  0   promethazine-dextromethorphan (PROMETHAZINE-DM) 6.25-15 MG/5ML syrup Take 5 mLs by mouth at bedtime as needed for cough. 100 mL 0   pseudoephedrine (SUDAFED) 60 MG tablet Take 1 tablet (60 mg total) by mouth every 8 (eight) hours as needed for congestion. 30 tablet 0   No facility-administered medications prior to visit.     Per HPI unless specifically indicated in ROS section below Review of Systems  Constitutional:  Positive for fever. Negative for chills and fatigue.  HENT:  Positive for congestion, ear pain, postnasal drip, rhinorrhea and sore  throat.   Respiratory:  Positive for cough. Negative for shortness of breath.   Cardiovascular:  Negative for chest pain.  Gastrointestinal:  Negative for abdominal pain, diarrhea, nausea and vomiting.  Musculoskeletal:  Positive for myalgias.  Neurological:  Positive for headaches. Negative for dizziness and light-headedness.  Objective:  Pulse (!) 55 Comment: per patient  Temp 100.2 F (37.9 C) Comment: per patient  Wt Readings from Last 3 Encounters:  03/30/21 176 lb 12.8 oz (80.2 kg)  11/10/20 182 lb 9.6 oz (82.8 kg)  05/28/20 170 lb (77.1 kg)       Physical exam: Gen: alert, NAD, not ill appearing Pulm: speaks in complete sentences without increased work of breathing Psych: normal mood, normal thought content      Results for orders placed or performed in visit on 11/08/19  SARS-COV-2 IgG  Result Value Ref Range   SARS-COV-2 IgG 0.02 Non-Reactive Non-Reactive  HIV Antibody (routine testing w rflx)  Result Value Ref Range   HIV 1&2 Ab, 4th Generation NON-REACTIVE NON-REACTI   Assessment & Plan:   Problem List Items Addressed This Visit       Other   COVID-19 virus infection - Primary    RF: age, asthma, former smoker Patient COVID-19 positive.  Did discuss with patient options for oral antiviral medications.  Did discuss that he is emergency use authorized only.  After discussion made decision for molnupiravir.  Discussed signs and symptoms with patient as when she needs to be seen urgently or emergently.  Also discussed CDC guidelines in regards to quarantine.  She may continue using over-the-counter medications if she sees benefit in them. Start molnupiravir      Relevant Medications   molnupiravir EUA (LAGEVRIO) 200 mg CAPS capsule     No orders of the defined types were placed in this encounter.  No orders of the defined types were placed in this encounter.   I discussed the assessment and treatment plan with the patient. The patient was provided an  opportunity to ask questions and all were answered. The patient agreed with the plan and demonstrated an understanding of the instructions. The patient was advised to call back or seek an in-person evaluation if the symptoms worsen or if the condition fails to improve as anticipated.  Follow up plan: No follow-ups on file.  Romilda Garret, NP

## 2021-09-09 NOTE — Assessment & Plan Note (Addendum)
RF: age, asthma, former smoker Patient COVID-19 positive.  Did discuss with patient options for oral antiviral medications.  Did discuss that he is emergency use authorized only.  After discussion made decision for molnupiravir.  Discussed signs and symptoms with patient as when she needs to be seen urgently or emergently.  Also discussed CDC guidelines in regards to quarantine.  She may continue using over-the-counter medications if she sees benefit in them. Start molnupiravir

## 2021-10-08 ENCOUNTER — Other Ambulatory Visit (HOSPITAL_COMMUNITY): Payer: Self-pay

## 2021-10-08 MED ORDER — PROMETHAZINE-DM 6.25-15 MG/5ML PO SYRP
5.0000 mL | ORAL_SOLUTION | Freq: Every evening | ORAL | 0 refills | Status: DC | PRN
  Filled 2021-10-08: qty 100, 20d supply, fill #0

## 2021-10-08 MED ORDER — BENZONATATE 100 MG PO CAPS
100.0000 mg | ORAL_CAPSULE | Freq: Three times a day (TID) | ORAL | 0 refills | Status: DC | PRN
  Filled 2021-10-08: qty 20, 7d supply, fill #0

## 2021-10-08 MED ORDER — AZITHROMYCIN 250 MG PO TABS
ORAL_TABLET | ORAL | 0 refills | Status: DC
  Filled 2021-10-08: qty 6, 5d supply, fill #0

## 2021-11-09 ENCOUNTER — Other Ambulatory Visit: Payer: Self-pay | Admitting: Obstetrics and Gynecology

## 2021-11-09 DIAGNOSIS — D7389 Other diseases of spleen: Secondary | ICD-10-CM

## 2021-11-13 ENCOUNTER — Encounter: Payer: Self-pay | Admitting: Internal Medicine

## 2021-11-13 ENCOUNTER — Other Ambulatory Visit: Payer: Self-pay

## 2021-11-13 ENCOUNTER — Ambulatory Visit (INDEPENDENT_AMBULATORY_CARE_PROVIDER_SITE_OTHER): Payer: No Typology Code available for payment source | Admitting: Internal Medicine

## 2021-11-13 VITALS — BP 112/70 | HR 76 | Temp 98.6°F | Ht 66.5 in | Wt 174.0 lb

## 2021-11-13 DIAGNOSIS — Z23 Encounter for immunization: Secondary | ICD-10-CM | POA: Diagnosis not present

## 2021-11-13 DIAGNOSIS — Z0000 Encounter for general adult medical examination without abnormal findings: Secondary | ICD-10-CM

## 2021-11-13 DIAGNOSIS — E7800 Pure hypercholesterolemia, unspecified: Secondary | ICD-10-CM

## 2021-11-13 DIAGNOSIS — Z0001 Encounter for general adult medical examination with abnormal findings: Secondary | ICD-10-CM

## 2021-11-13 NOTE — Patient Instructions (Addendum)
You had the Shingrix (shingles) shot #1 today  Please return in 2 motnsh (or shortly after) for a NURSE VISIT for Shingles shot #2  We have discussed the Cardiac CT Score test to measure the calcification level (if any) in your heart arteries.  This test has been ordered in our [place], so please call [place] [facility] directly, as they prefer this, at [phone] to be scheduled.  Please continue all other medications as before, and refills have been done if requested.  Please have the pharmacy call with any other refills you may need.  Please continue your efforts at being more active, low cholesterol diet, and weight control.  You are otherwise up to date with prevention measures today.  Please keep your appointments with your specialists as you may have planned  Please make an Appointment to return for your 1 year visit, or sooner if needed

## 2021-11-13 NOTE — Progress Notes (Signed)
Patient ID: Andrea Bonilla, female   DOB: 1971/09/27, 50 y.o.   MRN: [id number]         Chief Complaint:: wellness exam and hld       HPI:  Andrea Bonilla is a 50 y.o. female here for wellness exam, already seen per derm and GYN earlier this yr, due for shingrix o/w up to date. Lost several lbs with better diet.  Pt denies chest pain, increased sob or doe, wheezing, orthopnea, PND, increased LE swelling, palpitations, dizziness or syncope.   Pt denies polydipsia, polyuria, or new focal neuro s/s.   Pt denies fever, wt loss, night sweats, loss of appetite, or other constitutional symptoms  No other new complaints Wt Readings from Last 3 Encounters:  11/13/21 174 lb (78.9 kg)  03/30/21 176 lb 12.8 oz (80.2 kg)  11/10/20 182 lb 9.6 oz (82.8 kg)   BP Readings from Last 3 Encounters:  11/13/21 112/70  07/02/21 (!) 146/72  03/30/21 106/70   Immunization History  Administered Date(s) Administered   Influenza Split 09/13/2011, 09/13/2012   Influenza Whole 09/04/2008, 09/05/2009, 09/07/2010   Influenza,inj,Quad PF,6+ Mos 07/31/2014, 10/02/2021   Influenza-Unspecified 09/15/2015, 08/06/2017, 09/04/2018, 09/04/2019, 10/10/2020   PFIZER(Purple Top)SARS-COV-2 Vaccination 08/06/2020, 09/05/2020, 04/05/2021   Td 12/06/2006   Tdap 11/08/2014   Zoster Recombinat (Shingrix) 11/13/2021   Health Maintenance Due  Topic Date Due   Hepatitis C Screening  Never done   PAP SMEAR-Modifier  10/23/2021      Past Medical History:  Diagnosis Date   ALLERGIC RHINITIS    ASTHMA    Asthma    HYPERLIPIDEMIA    IBS (irritable bowel syndrome)    Kidney stones    OSTEOPENIA    Splenic lesion    STRESS FRACTURE, FOOT    Past Surgical History:  Procedure Laterality Date   BREAST SURGERY     skin graft Right forehead      reports that she has quit smoking. She has never used smokeless tobacco. She reports current alcohol use. She reports that she does not use drugs. family history includes Colon polyps  in her brother, father, and mother; Coronary artery disease in an other family member; Diabetes in an other family member; Heart disease in her mother; Hyperlipidemia in her mother; Hypertension in her mother and another family member; Stroke in an other family member. Allergies  Allergen Reactions   Clarithromycin     REACTION: nausea   Codeine    Erythromycin    Moxifloxacin     REACTION: nausea   Current Outpatient Medications on File Prior to Visit  Medication Sig Dispense Refill   AMBULATORY NON FORMULARY MEDICATION Medication Name: Ultimate Flora Probiotic-30B Live Cultures- 1 capsule daily     AMBULATORY NON FORMULARY MEDICATION Medication Name: Amberen OTC menopausal Relief-- 2 tablets daily     Biotin 10000 MCG TABS Take 1 tablet by mouth daily.     CALCIUM-VITAMIN D PO Take 2 tablets by mouth daily.     cetirizine (ZYRTEC ALLERGY) 10 MG tablet Take 1 tablet (10 mg total) by mouth daily. 30 tablet 0   Cholecalciferol (VITAMIN D3) 1000 UNITS CAPS Take 1 capsule by mouth daily.     Cyanocobalamin (VITAMIN B 12 PO) Take 100 mg by mouth daily.     dicyclomine (BENTYL) 10 MG capsule Take 1 capsule (10 mg total) by mouth 2 (two) times daily. 60 capsule 6   escitalopram (LEXAPRO) 10 MG tablet Take 1 tablet (10 mg total) by mouth  daily. 30 tablet 6   ferrous sulfate 325 (65 FE) MG tablet Take 325 mg by mouth daily with breakfast.     Flaxseed, Linseed, 1000 MG CAPS Take 2 capsules by mouth daily.     L-Lysine 500 MG TABS Take 2 tablets by mouth daily.     Multiple Vitamins-Minerals (HAIR SKIN & NAILS ADVANCED PO) Take 1 tablet by mouth daily.     NIACIN PO Take 1 tablet by mouth daily.     pantoprazole (PROTONIX) 40 MG tablet TAKE 1 TABLET BY MOUTH  DAILY 90 tablet 2   vitamin E 400 UNIT capsule Take 400 Units by mouth daily.     Multiple Vitamin (MULTIVITAMIN) tablet Take 1 tablet by mouth daily.     No current facility-administered medications on file prior to visit.        ROS:   All others reviewed and negative.  Objective        PE:  BP 112/70 (BP Location: Right Arm, Patient Position: Sitting, Cuff Size: Normal)   Pulse 76   Temp 98.6 F (37 C) (Oral)   Ht 5' 6.5" (1.689 m)   Wt 174 lb (78.9 kg)   SpO2 99%   BMI 27.66 kg/m                 Constitutional: Pt appears in NAD               HENT: Head: NCAT.                Right Ear: External ear normal.                 Left Ear: External ear normal.                Eyes: . Pupils are equal, round, and reactive to light. Conjunctivae and EOM are normal               Nose: without d/c or deformity               Neck: Neck supple. Gross normal ROM               Cardiovascular: Normal rate and regular rhythm.                 Pulmonary/Chest: Effort normal and breath sounds without rales or wheezing.                Abd:  Soft, NT, ND, + BS, no organomegaly               Neurological: Pt is alert. At baseline orientation, motor grossly intact               Skin: Skin is warm. No rashes, no other new lesions, LE edema - none               Psychiatric: Pt behavior is normal without agitation   Micro: none  Cardiac tracings I have personally interpreted today:  none  Pertinent Radiological findings (summarize): none   Lab Results  Component Value Date   WBC 7.7 07/17/2019   HGB 13.5 07/17/2019   HCT 39.1 07/17/2019   PLT 239 07/17/2019   GLUCOSE 110 (H) 07/17/2019   CHOL 238 (H) 11/08/2014   TRIG 203.0 (H) 11/08/2014   HDL 65.00 11/08/2014   LDLDIRECT 139.2 11/08/2014   ALT 21 07/17/2019   AST 21 07/17/2019   NA 134 (L) 07/17/2019   K 3.7 07/17/2019  CL 101 07/17/2019   CREATININE 0.89 07/17/2019   BUN 9 07/17/2019   CO2 24 07/17/2019   TSH 2.98 11/08/2014   INR 0.89 06/17/2011   Assessment/Plan:  Andrea Bonilla is a 50 y.o. White or Caucasian [1] female with  has a past medical history of ALLERGIC RHINITIS, ASTHMA, Asthma, HYPERLIPIDEMIA, IBS (irritable bowel syndrome), Kidney stones,  OSTEOPENIA, Splenic lesion, and STRESS FRACTURE, FOOT.  Preventative health care Age and sex appropriate education and counseling updated with regular exercise and diet Referrals for preventative services - had labs per GYN Immunizations addressed - for shingrix #1 today and f/u #2 in 2-6 months Smoking counseling  - none needed Evidence for depression or other mood disorder - none significant Most recent labs reviewed. I have personally reviewed and have noted: 1) the patient's medical and social history 2) The patient's current medications and supplements 3) The patient's height, weight, and BMI have been recorded in the chart   HLD (hyperlipidemia)  - most recent LDL per derm at 123;  Stable, pt to continue current low chol diet, declines statin, but also for Ct Cardiac score  Followup: Return in about 1 year (around 11/13/2022).  Cathlean Cower, MD 11/14/2021 10:08 PM [facility]

## 2021-11-14 ENCOUNTER — Encounter: Payer: Self-pay | Admitting: Internal Medicine

## 2021-11-14 NOTE — Assessment & Plan Note (Signed)
-   most recent LDL per derm at 123;  Stable, pt to continue current low chol diet, declines statin, but also for Ct Cardiac score

## 2021-11-14 NOTE — Assessment & Plan Note (Signed)
Age and sex appropriate education and counseling updated with regular exercise and diet Referrals for preventative services - had labs per GYN Immunizations addressed - for shingrix #1 today and f/u #2 in 2-6 months Smoking counseling  - none needed Evidence for depression or other mood disorder - none significant Most recent labs reviewed. I have personally reviewed and have noted: 1) the patient's medical and social history 2) The patient's current medications and supplements 3) The patient's height, weight, and BMI have been recorded in the chart

## 2021-11-19 ENCOUNTER — Other Ambulatory Visit: Payer: Self-pay | Admitting: Obstetrics and Gynecology

## 2021-11-19 ENCOUNTER — Ambulatory Visit
Admission: RE | Admit: 2021-11-19 | Discharge: 2021-11-19 | Disposition: A | Discharge status: Home / Self Care | Payer: No Typology Code available for payment source | Source: Ambulatory Visit | Attending: Obstetrics and Gynecology | Admitting: Obstetrics and Gynecology

## 2021-11-19 DIAGNOSIS — D7389 Other diseases of spleen: Secondary | ICD-10-CM

## 2021-12-29 ENCOUNTER — Ambulatory Visit (INDEPENDENT_AMBULATORY_CARE_PROVIDER_SITE_OTHER)
Admission: RE | Admit: 2021-12-29 | Discharge: 2021-12-29 | Disposition: A | Discharge status: Home / Self Care | Payer: Self-pay | Source: Ambulatory Visit | Attending: Internal Medicine | Admitting: Internal Medicine

## 2021-12-29 ENCOUNTER — Other Ambulatory Visit: Payer: Self-pay

## 2021-12-29 DIAGNOSIS — E7800 Pure hypercholesterolemia, unspecified: Secondary | ICD-10-CM

## 2022-01-18 ENCOUNTER — Other Ambulatory Visit (HOSPITAL_COMMUNITY): Payer: Self-pay

## 2022-01-18 MED ORDER — AZITHROMYCIN 250 MG PO TABS
ORAL_TABLET | ORAL | 1 refills | Status: AC
  Filled 2022-01-18: qty 6, 5d supply, fill #0
  Filled 2022-01-22: qty 6, 5d supply, fill #1

## 2022-01-22 ENCOUNTER — Other Ambulatory Visit (HOSPITAL_COMMUNITY): Payer: Self-pay

## 2022-02-09 ENCOUNTER — Other Ambulatory Visit (HOSPITAL_BASED_OUTPATIENT_CLINIC_OR_DEPARTMENT_OTHER): Payer: Self-pay

## 2022-04-27 ENCOUNTER — Other Ambulatory Visit: Payer: Self-pay | Admitting: Gastroenterology

## 2022-06-01 ENCOUNTER — Other Ambulatory Visit (HOSPITAL_COMMUNITY): Payer: Self-pay

## 2022-06-01 MED ORDER — MELOXICAM 15 MG PO TABS
15.0000 mg | ORAL_TABLET | Freq: Every day | ORAL | 0 refills | Status: AC | PRN
  Filled 2022-06-01: qty 14, 14d supply, fill #0

## 2022-06-25 ENCOUNTER — Other Ambulatory Visit (HOSPITAL_COMMUNITY): Payer: Self-pay

## 2022-06-25 MED ORDER — PEG 3350-KCL-NA BICARB-NACL 420 G PO SOLR
ORAL | 0 refills | Status: DC
  Filled 2022-06-25: qty 4000, 1d supply, fill #0

## 2022-06-29 ENCOUNTER — Ambulatory Visit (INDEPENDENT_AMBULATORY_CARE_PROVIDER_SITE_OTHER): Payer: No Typology Code available for payment source | Admitting: Allergy and Immunology

## 2022-06-29 ENCOUNTER — Encounter: Payer: Self-pay | Admitting: Allergy and Immunology

## 2022-06-29 VITALS — BP 122/72 | HR 56 | Temp 98.3°F | Ht 66.5 in | Wt 176.0 lb

## 2022-06-29 DIAGNOSIS — Z860100 Personal history of colon polyps, unspecified: Secondary | ICD-10-CM | POA: Insufficient documentation

## 2022-06-29 DIAGNOSIS — K200 Eosinophilic esophagitis: Secondary | ICD-10-CM

## 2022-06-29 DIAGNOSIS — K219 Gastro-esophageal reflux disease without esophagitis: Secondary | ICD-10-CM | POA: Insufficient documentation

## 2022-06-29 NOTE — Patient Instructions (Signed)
  1.  Allergen avoidance measures???  2.  Eliminate all supplements except multivitamin, iron, calcium, vitamin D  3.  Minimize all caffeine and chocolate consumption to minimize reflux  4.  Continue pantoprazole 40 mg - 1 tablet 1 time per day  5.  Review esophageal biopsy results.  Further treatment?  6.  Return to clinic in 4 weeks or earlier if problem.  Keep mental diary of obstructive esophageal events

## 2022-06-29 NOTE — Progress Notes (Signed)
[name] [place] [facility] [place] [facility] [place]   Dear [name],  Thank you for referring Andrea Bonilla to the [facility] on 06/29/2022.   Below is a summation of this patient's evaluation and recommendations.  Thank you for your referral. I will keep you informed about this patient's response to treatment.   If you have any questions please do not hesitate to contact me.   Sincerely,  Jiles Prows, MD Allergy / Immunology [place]   ______________________________________________________________________    NEW PATIENT NOTE  Referring Provider: Biagio Borg, MD Primary Provider: Biagio Borg, MD Date of office visit: 06/29/2022    Subjective:   Chief Complaint:  Andrea Bonilla (DOB: 1971-09-01) is a 51 y.o. female who presents to the clinic on 06/29/2022 with a chief complaint of New Patient (Initial Visit) (Possible inflammation in esophagus after endoscopy, concerned about allergic inflammation; no previous allergy testing; does report seasonal allergies; concerned about food allergies) .     HPI: Ange presents to this clinic in evaluation of possible eosinophilic esophagitis.  She has been having a problem with obstruction with swallowing over the course of the past year with progressive frequency for which she went to visit with Dr. Prince Rome who performed an upper endoscopy in May 2023 which identified eosinophilic esophagitis.  Treatment has included increasing the use of her proton pump inhibitor which was initially intermittent and is now on a daily basis with use of pantoprazole 40 mg a day.  She drinks 1 coffee per day, has chocolate a few times per week, and does not drink any alcohol.  She has some very mild allergic disease usually presenting during the spring and fall with rhinitis for which she will use some Zyrtec and Flonase.  She has a history of childhood  asthma which is inactive.  At this point in time the biopsy of her esophagus during her recent upper endoscopy is not available for review.  Past Medical History:  Diagnosis Date  . ALLERGIC RHINITIS   . ASTHMA   . Asthma   . HYPERLIPIDEMIA   . IBS (irritable bowel syndrome)   . Kidney stones   . OSTEOPENIA   . Splenic lesion   . STRESS FRACTURE, FOOT     Past Surgical History:  Procedure Laterality Date  . BREAST SURGERY    . skin graft Right forehead      Allergies as of 06/29/2022       Reactions   Clarithromycin    REACTION: nausea   Codeine    Erythromycin    Moxifloxacin    REACTION: nausea        Medication List    AMBULATORY NON FORMULARY MEDICATION Medication Name: Ultimate Flora Probiotic-30B Live Cultures- 1 capsule daily   AMBULATORY NON FORMULARY MEDICATION Medication Name: Amberen OTC menopausal Relief-- 2 tablets daily   Biotin 10000 MCG Tabs Take 1 tablet by mouth daily.   CALCIUM-VITAMIN D PO Take 2 tablets by mouth daily.   cetirizine 10 MG tablet Commonly known as: ZyrTEC Allergy Take 1 tablet (10 mg total) by mouth daily.   dicyclomine 10 MG capsule Commonly known as: BENTYL Take 1 capsule (10 mg total) by mouth 2 (two) times daily.   escitalopram 10 MG tablet Commonly known as: Lexapro Take 1 tablet (10 mg total) by mouth daily.   ferrous sulfate 325 (65 FE) MG tablet Take 325  mg by mouth daily with breakfast.   Flaxseed (Linseed) 1000 MG Caps Take 2 capsules by mouth daily.   HAIR SKIN & NAILS ADVANCED PO Take 1 tablet by mouth daily.   L-Lysine 500 MG Tabs Take 2 tablets by mouth daily.   meloxicam 15 MG tablet Commonly known as: MOBIC Take 1 tablet (15 mg total) by mouth daily as needed for pain   multivitamin tablet Take 1 tablet by mouth daily.   NIACIN PO Take 1 tablet by mouth daily.   pantoprazole 40 MG tablet Commonly known as: PROTONIX TAKE 1 TABLET BY MOUTH  DAILY   polyethylene glycol-electrolytes  420 g solution Commonly known as: NuLYTELY Use as directed.   VITAMIN B 12 PO Take 100 mg by mouth daily.   Vitamin D3 25 MCG (1000 UT) Caps Take 1 capsule by mouth daily.   vitamin E 180 MG (400 UNITS) capsule Take 400 Units by mouth daily.     Review of systems negative except as noted in HPI / PMHx or noted below:  Review of Systems  Constitutional: Negative.   HENT: Negative.    Eyes: Negative.   Respiratory: Negative.    Cardiovascular: Negative.   Gastrointestinal: Negative.   Genitourinary: Negative.   Musculoskeletal: Negative.   Skin: Negative.   Neurological: Negative.   Endo/Heme/Allergies: Negative.   Psychiatric/Behavioral: Negative.      Family History  Problem Relation Age of Onset  . Coronary artery disease Other        1st degree relative female <50  . Diabetes Other   . Hypertension Other   . Stroke Other   . Hypertension Mother   . Heart disease Mother   . Hyperlipidemia Mother   . Colon polyps Mother   . Colon polyps Father   . Colon polyps Brother   . Esophageal cancer Neg Hx   . Stomach cancer Neg Hx   . Liver disease Neg Hx   . Pancreatic cancer Neg Hx     Social History   Socioeconomic History  . Marital status: Married    Spouse name: Not on file  . Number of children: Not on file  . Years of education: Not on file  . Highest education level: Not on file  Occupational History  . Occupation: Presenter, broadcasting [employer]  Tobacco Use  . Smoking status: Former  . Smokeless tobacco: Never  Substance and Sexual Activity  . Alcohol use: Yes    Comment: rare  . Drug use: No  . Sexual activity: Not on file  Other Topics Concern  . Not on file  Social History Narrative  . Not on file   Environmental and Social history  Lives in a house with a dry environment, cats located inside the household, carpet in the bedroom, plastic on the bed, plastic on the pillow, and no smoking ongoing with inside the household.  She works in an  office setting.  Objective:   Vitals:   06/29/22 0933  BP: 122/72  Pulse: (!) 56  Temp: 98.3 F (36.8 C)  SpO2: 98%   Height: 5' 6.5" (168.9 cm) Weight: 176 lb (79.8 kg)  Physical Exam Constitutional:      Appearance: She is not diaphoretic.  HENT:     Head: Normocephalic.     Right Ear: Tympanic membrane, ear canal and external ear normal.     Left Ear: Tympanic membrane, ear canal and external ear normal.     Nose: Nose normal. No mucosal edema or  rhinorrhea.     Mouth/Throat:     Pharynx: Uvula midline. No oropharyngeal exudate.  Eyes:     Conjunctiva/sclera: Conjunctivae normal.  Neck:     Thyroid: No thyromegaly.     Trachea: Trachea normal. No tracheal tenderness or tracheal deviation.  Cardiovascular:     Rate and Rhythm: Normal rate and regular rhythm.     Heart sounds: Normal heart sounds, S1 normal and S2 normal. No murmur heard. Pulmonary:     Effort: No respiratory distress.     Breath sounds: Normal breath sounds. No stridor. No wheezing or rales.  Lymphadenopathy:     Head:     Right side of head: No tonsillar adenopathy.     Left side of head: No tonsillar adenopathy.     Cervical: No cervical adenopathy.  Skin:    Findings: No erythema or rash.     Nails: There is no clubbing.  Neurological:     Mental Status: She is alert.    Diagnostics: Allergy skin tests were performed.   Assessment and Plan:    No diagnosis found.  There are no Patient Instructions on file for this visit.   Jiles Prows, MD Allergy / Immunology [facility] [facility]

## 2022-06-30 ENCOUNTER — Encounter: Payer: Self-pay | Admitting: Allergy and Immunology

## 2022-07-02 ENCOUNTER — Telehealth: Payer: Self-pay

## 2022-07-02 NOTE — Telephone Encounter (Signed)
Received patient's endoscopy path report - 04/06/22 - from Dr. [facility].  Report will be placed on provider's desk for review.

## 2022-07-07 ENCOUNTER — Ambulatory Visit: Payer: BC Managed Care – PPO | Admitting: Allergy and Immunology

## 2022-07-08 ENCOUNTER — Other Ambulatory Visit (HOSPITAL_COMMUNITY): Payer: Self-pay

## 2022-08-24 ENCOUNTER — Ambulatory Visit: Payer: No Typology Code available for payment source | Admitting: Allergy and Immunology

## 2022-09-07 ENCOUNTER — Other Ambulatory Visit (HOSPITAL_COMMUNITY): Payer: Self-pay

## 2022-09-07 MED ORDER — TOPIRAMATE 25 MG PO TABS
25.0000 mg | ORAL_TABLET | Freq: Every day | ORAL | 1 refills | Status: AC
  Filled 2022-09-07: qty 30, 30d supply, fill #0
  Filled 2022-10-01: qty 30, 30d supply, fill #1

## 2022-09-08 ENCOUNTER — Other Ambulatory Visit (HOSPITAL_COMMUNITY): Payer: Self-pay

## 2022-09-21 ENCOUNTER — Other Ambulatory Visit (HOSPITAL_COMMUNITY): Payer: Self-pay

## 2022-09-21 MED ORDER — TOPIRAMATE 25 MG PO TABS
25.0000 mg | ORAL_TABLET | Freq: Every day | ORAL | 1 refills | Status: DC
  Filled 2022-09-21 – 2022-11-02 (×2): qty 30, 30d supply, fill #0
  Filled 2022-11-28: qty 30, 30d supply, fill #1

## 2022-10-01 ENCOUNTER — Other Ambulatory Visit (HOSPITAL_COMMUNITY): Payer: Self-pay

## 2022-10-19 ENCOUNTER — Other Ambulatory Visit: Payer: Self-pay

## 2022-10-19 ENCOUNTER — Encounter: Payer: Self-pay | Admitting: Allergy and Immunology

## 2022-10-19 ENCOUNTER — Ambulatory Visit (INDEPENDENT_AMBULATORY_CARE_PROVIDER_SITE_OTHER): Payer: No Typology Code available for payment source | Admitting: Allergy and Immunology

## 2022-10-19 VITALS — BP 120/70 | HR 62 | Temp 98.2°F | Resp 18 | Ht 65.75 in | Wt 172.9 lb

## 2022-10-19 DIAGNOSIS — K200 Eosinophilic esophagitis: Secondary | ICD-10-CM

## 2022-10-20 ENCOUNTER — Encounter: Payer: Self-pay | Admitting: Allergy and Immunology

## 2022-10-20 NOTE — Progress Notes (Signed)
Andrea Bonilla returns to this clinic for skin testing.  She did not demonstrate any hypersensitivity against a screening panel of aeroallergens or foods.

## 2022-10-21 NOTE — Addendum Note (Signed)
Addended by: Felipa Emory on: 10/21/2022 08:17 AM   Modules accepted: Orders

## 2022-11-02 ENCOUNTER — Other Ambulatory Visit (HOSPITAL_COMMUNITY): Payer: Self-pay

## 2022-11-03 ENCOUNTER — Other Ambulatory Visit (HOSPITAL_COMMUNITY): Payer: Self-pay

## 2022-11-08 ENCOUNTER — Other Ambulatory Visit (HOSPITAL_COMMUNITY): Payer: Self-pay

## 2022-11-08 MED ORDER — ROSUVASTATIN CALCIUM 5 MG PO TABS
5.0000 mg | ORAL_TABLET | Freq: Every day | ORAL | 6 refills | Status: DC
  Filled 2022-11-08: qty 30, 30d supply, fill #0
  Filled 2022-11-28 – 2022-12-01 (×2): qty 30, 30d supply, fill #1
  Filled 2022-12-27: qty 30, 30d supply, fill #2
  Filled 2023-02-08: qty 30, 30d supply, fill #3
  Filled 2023-03-08: qty 30, 30d supply, fill #4
  Filled 2023-04-05: qty 30, 30d supply, fill #5
  Filled 2023-05-09: qty 30, 30d supply, fill #6

## 2022-11-09 ENCOUNTER — Other Ambulatory Visit (HOSPITAL_COMMUNITY): Payer: Self-pay

## 2022-11-17 ENCOUNTER — Encounter: Payer: Self-pay | Admitting: Internal Medicine

## 2022-11-17 ENCOUNTER — Ambulatory Visit (INDEPENDENT_AMBULATORY_CARE_PROVIDER_SITE_OTHER): Payer: No Typology Code available for payment source | Admitting: Internal Medicine

## 2022-11-17 VITALS — BP 112/68 | HR 67 | Temp 97.8°F | Ht 65.0 in | Wt 172.0 lb

## 2022-11-17 DIAGNOSIS — Z0000 Encounter for general adult medical examination without abnormal findings: Secondary | ICD-10-CM | POA: Diagnosis not present

## 2022-11-17 NOTE — Patient Instructions (Signed)
Please continue all other medications as before, and refills have been done if requested.  Please have the pharmacy call with any other refills you may need.  Please continue your efforts at being more active, low cholesterol diet, and weight control.  You are otherwise up to date with prevention measures today.  Please keep your appointments with your specialists as you may have planned  We can hold on lab testing since you just had labs with GYN  Please make an Appointment to return for your 1 year visit, or sooner if needed

## 2022-11-17 NOTE — Progress Notes (Addendum)
Patient ID: Andrea Bonilla, female   DOB: [birth year], 51 y.o.   MRN: [id number]         Chief Complaint:: wellness exam        HPI:  Andrea Bonilla is a 51 y.o. female here for wellness exam; declines covid booster, hep c screen, o/w up to date                        Also Pt denies chest pain, increased sob or doe, wheezing, orthopnea, PND, increased LE swelling, palpitations, dizziness or syncope.   Pt denies polydipsia, polyuria, or new focal neuro s/s. Had labs with GYN and will forward results.   Wt Readings from Last 3 Encounters:  11/17/22 172 lb (78 kg)  10/19/22 172 lb 14.4 oz (78.4 kg)  06/29/22 176 lb (79.8 kg)   BP Readings from Last 3 Encounters:  11/17/22 112/68  10/19/22 120/70  06/29/22 122/72   Immunization History  Administered Date(s) Administered   Influenza Split 09/13/2011, 09/13/2012, 10/10/2020, 10/10/2021   Influenza Whole 09/04/2008, 09/05/2009, 09/07/2010   Influenza,inj,Quad PF,6+ Mos 07/31/2014, 10/02/2021   Influenza-Unspecified 09/15/2015, 08/06/2017, 09/04/2018, 09/04/2019, 10/10/2020, 09/20/2022   PFIZER(Purple Top)SARS-COV-2 Vaccination 08/06/2020, 09/05/2020, 04/05/2021   Td 12/06/2006   Tdap 11/08/2014   Zoster Recombinat (Shingrix) 11/13/2021, 06/06/2022   There are no preventive care reminders to display for this patient.     Past Medical History:  Diagnosis Date   ALLERGIC RHINITIS    ASTHMA    Asthma    HYPERLIPIDEMIA    IBS (irritable bowel syndrome)    Kidney stones    OSTEOPENIA    Splenic lesion    STRESS FRACTURE, FOOT    Past Surgical History:  Procedure Laterality Date   BREAST SURGERY     skin graft Right forehead      reports that she has quit smoking. She has never used smokeless tobacco. She reports current alcohol use. She reports that she does not use drugs. family history includes Colon polyps in her brother, father, and mother; Coronary artery disease in an other family member; Diabetes in an other family  member; Heart disease in her mother; Hyperlipidemia in her mother; Hypertension in her mother and another family member; Stroke in an other family member. Allergies  Allergen Reactions   Clarithromycin     REACTION: nausea   Codeine Other (See Comments)   Erythromycin    Moxifloxacin     REACTION: nausea   Current Outpatient Medications on File Prior to Visit  Medication Sig Dispense Refill   AMBULATORY NON FORMULARY MEDICATION Medication Name: Ultimate Flora Probiotic-30B Live Cultures- 1 capsule daily     AMBULATORY NON FORMULARY MEDICATION Medication Name: Amberen OTC menopausal Relief-- 2 tablets daily     Biotin 10000 MCG TABS Take 1 tablet by mouth daily.     CALCIUM-VITAMIN D PO Take 2 tablets by mouth daily.     cetirizine (ZYRTEC ALLERGY) 10 MG tablet Take 1 tablet (10 mg total) by mouth daily. 30 tablet 0   Cholecalciferol (VITAMIN D3) 1000 UNITS CAPS Take 1 capsule by mouth daily.     Cyanocobalamin (VITAMIN B 12 PO) Take 100 mg by mouth daily.     dicyclomine (BENTYL) 10 MG capsule Take 1 capsule (10 mg total) by mouth 2 (two) times daily. 60 capsule 6   escitalopram (LEXAPRO) 10 MG tablet Take 1 tablet (10 mg total) by mouth daily. 30 tablet 6   ferrous sulfate 325 (  65 FE) MG tablet Take 325 mg by mouth daily with breakfast.     Flaxseed, Linseed, 1000 MG CAPS Take 2 capsules by mouth daily.     L-Lysine 500 MG TABS Take 2 tablets by mouth daily.     meloxicam (MOBIC) 15 MG tablet Take 1 tablet (15 mg total) by mouth daily as needed for pain 14 tablet 0   Multiple Vitamin (MULTIVITAMIN) tablet Take 1 tablet by mouth daily.     Multiple Vitamins-Minerals (HAIR SKIN & NAILS ADVANCED PO) Take 1 tablet by mouth daily.     NIACIN PO Take 1 tablet by mouth daily.     pantoprazole (PROTONIX) 40 MG tablet TAKE 1 TABLET BY MOUTH  DAILY 90 tablet 2   rosuvastatin (CRESTOR) 5 MG tablet Take 1 tablet (5 mg total) by mouth daily. 30 tablet 6   topiramate (TOPAMAX) 25 MG tablet Take 1  tablet (25 mg total) by mouth daily. 30 tablet 1   topiramate (TOPAMAX) 25 MG tablet Take 1 tablet (25 mg total) by mouth daily. 30 tablet 1   vitamin E 400 UNIT capsule Take 400 Units by mouth daily.     No current facility-administered medications on file prior to visit.        ROS:  All others reviewed and negative.  Objective        PE:  BP 112/68 (BP Location: Left Arm, Patient Position: Sitting, Cuff Size: Normal)   Pulse 67   Temp 97.8 F (36.6 C) (Oral)   Ht '5\' 5"'$  (1.651 m)   Wt 172 lb (78 kg)   SpO2 97%   BMI 28.62 kg/m                 Constitutional: Pt appears in NAD               HENT: Head: NCAT.                Right Ear: External ear normal.                 Left Ear: External ear normal.                Eyes: . Pupils are equal, round, and reactive to light. Conjunctivae and EOM are normal               Nose: without d/c or deformity               Neck: Neck supple. Gross normal ROM               Cardiovascular: Normal rate and regular rhythm.                 Pulmonary/Chest: Effort normal and breath sounds without rales or wheezing.                Abd:  Soft, NT, ND, + BS, no organomegaly               Neurological: Pt is alert. At baseline orientation, motor grossly intact               Skin: Skin is warm. No rashes, no other new lesions, LE edema - none               Psychiatric: Pt behavior is normal without agitation   Micro: none  Cardiac tracings I have personally interpreted today:  none  Pertinent Radiological findings (summarize): none   Lab Results  Component  Value Date   WBC 7.7 07/17/2019   HGB 13.5 07/17/2019   HCT 39.1 07/17/2019   PLT 239 07/17/2019   GLUCOSE 110 (H) 07/17/2019   CHOL 238 (H) 11/08/2014   TRIG 203.0 (H) 11/08/2014   HDL 65.00 11/08/2014   LDLDIRECT 139.2 11/08/2014   ALT 21 07/17/2019   AST 21 07/17/2019   NA 134 (L) 07/17/2019   K 3.7 07/17/2019   CL 101 07/17/2019   CREATININE 0.89 07/17/2019   BUN 9 07/17/2019    CO2 24 07/17/2019   TSH 2.98 11/08/2014   INR 0.89 06/17/2011   Assessment/Plan:  Andrea Bonilla is a 51 y.o. White or Caucasian [1] female with  has a past medical history of ALLERGIC RHINITIS, ASTHMA, Asthma, HYPERLIPIDEMIA, IBS (irritable bowel syndrome), Kidney stones, OSTEOPENIA, Splenic lesion, and STRESS FRACTURE, FOOT.  Preventative health care Age and sex appropriate education and counseling updated with regular exercise and diet Referrals for preventative services - declines hep c screen Immunizations addressed - declines covid booster Smoking counseling  - none needed Evidence for depression or other mood disorder - none significant Most recent labs reviewed. I have personally reviewed and have noted: 1) the patient's medical and social history 2) The patient's current medications and supplements 3) The patient's height, weight, and BMI have been recorded in the chart  Followup: Return in about 1 year (around 11/18/2023).  Cathlean Cower, MD 11/20/2022 5:15 AM [facility]

## 2022-11-20 ENCOUNTER — Encounter: Payer: Self-pay | Admitting: Internal Medicine

## 2022-11-20 NOTE — Assessment & Plan Note (Signed)
Age and sex appropriate education and counseling updated with regular exercise and diet Referrals for preventative services - declines hep c screen Immunizations addressed - declines covid booster Smoking counseling  - none needed Evidence for depression or other mood disorder - none significant Most recent labs reviewed. I have personally reviewed and have noted: 1) the patient's medical and social history 2) The patient's current medications and supplements 3) The patient's height, weight, and BMI have been recorded in the chart

## 2022-11-30 ENCOUNTER — Other Ambulatory Visit (HOSPITAL_COMMUNITY): Payer: Self-pay

## 2022-12-01 ENCOUNTER — Other Ambulatory Visit (HOSPITAL_COMMUNITY): Payer: Self-pay

## 2022-12-09 ENCOUNTER — Other Ambulatory Visit (HOSPITAL_COMMUNITY): Payer: Self-pay

## 2022-12-09 MED ORDER — TOPIRAMATE 25 MG PO TABS
25.0000 mg | ORAL_TABLET | Freq: Every day | ORAL | 1 refills | Status: AC
  Filled 2022-12-09 – 2022-12-27 (×2): qty 30, 30d supply, fill #0
  Filled 2023-04-05: qty 30, 30d supply, fill #1

## 2022-12-24 ENCOUNTER — Other Ambulatory Visit (HOSPITAL_COMMUNITY): Payer: Self-pay

## 2022-12-24 DIAGNOSIS — H93291 Other abnormal auditory perceptions, right ear: Secondary | ICD-10-CM | POA: Insufficient documentation

## 2022-12-24 DIAGNOSIS — J3489 Other specified disorders of nose and nasal sinuses: Secondary | ICD-10-CM | POA: Insufficient documentation

## 2022-12-24 MED ORDER — MUPIROCIN 2 % EX OINT
1.0000 | TOPICAL_OINTMENT | Freq: Three times a day (TID) | CUTANEOUS | 3 refills | Status: AC
  Filled 2022-12-24: qty 22, 8d supply, fill #0
  Filled 2023-02-08: qty 22, 8d supply, fill #1

## 2022-12-27 ENCOUNTER — Other Ambulatory Visit: Payer: Self-pay

## 2022-12-27 ENCOUNTER — Other Ambulatory Visit (HOSPITAL_COMMUNITY): Payer: Self-pay

## 2023-01-20 ENCOUNTER — Other Ambulatory Visit (HOSPITAL_COMMUNITY): Payer: Self-pay

## 2023-01-20 MED ORDER — BUPROPION HCL ER (XL) 150 MG PO TB24
150.0000 mg | ORAL_TABLET | Freq: Every morning | ORAL | 2 refills | Status: DC
  Filled 2023-01-20: qty 30, 30d supply, fill #0
  Filled 2023-02-14: qty 30, 30d supply, fill #1
  Filled 2023-03-16: qty 30, 30d supply, fill #2

## 2023-01-20 MED ORDER — TOPIRAMATE 25 MG PO TABS
25.0000 mg | ORAL_TABLET | Freq: Every day | ORAL | 1 refills | Status: AC
  Filled 2023-01-20: qty 30, 30d supply, fill #0
  Filled 2023-05-09: qty 30, 30d supply, fill #1

## 2023-01-26 ENCOUNTER — Other Ambulatory Visit (HOSPITAL_COMMUNITY): Payer: Self-pay

## 2023-01-26 MED ORDER — MELOXICAM 15 MG PO TABS
15.0000 mg | ORAL_TABLET | Freq: Every day | ORAL | 0 refills | Status: AC
  Filled 2023-01-26: qty 14, 14d supply, fill #0

## 2023-01-31 DIAGNOSIS — H903 Sensorineural hearing loss, bilateral: Secondary | ICD-10-CM | POA: Insufficient documentation

## 2023-03-07 ENCOUNTER — Other Ambulatory Visit (HOSPITAL_COMMUNITY): Payer: Self-pay

## 2023-03-07 MED ORDER — TOPIRAMATE 25 MG PO TABS
25.0000 mg | ORAL_TABLET | Freq: Every day | ORAL | 2 refills | Status: AC
  Filled 2023-03-07: qty 30, 30d supply, fill #0
  Filled 2023-06-02: qty 30, 30d supply, fill #1

## 2023-03-07 MED ORDER — BUPROPION HCL ER (XL) 150 MG PO TB24
150.0000 mg | ORAL_TABLET | Freq: Every morning | ORAL | 2 refills | Status: AC
  Filled 2023-03-07 – 2023-04-18 (×2): qty 30, 30d supply, fill #0
  Filled 2023-06-02: qty 30, 30d supply, fill #1

## 2023-03-29 ENCOUNTER — Other Ambulatory Visit (HOSPITAL_COMMUNITY): Payer: Self-pay

## 2023-03-29 MED ORDER — HYDROCOD POLI-CHLORPHE POLI ER 10-8 MG/5ML PO SUER
5.0000 mL | Freq: Two times a day (BID) | ORAL | 0 refills | Status: AC | PRN
  Filled 2023-03-29: qty 120, 12d supply, fill #0

## 2023-03-29 MED ORDER — IPRATROPIUM BROMIDE 0.06 % NA SOLN
2.0000 | Freq: Four times a day (QID) | NASAL | 0 refills | Status: AC
  Filled 2023-03-29: qty 15, 19d supply, fill #0

## 2023-03-29 MED ORDER — AMOXICILLIN-POT CLAVULANATE 875-125 MG PO TABS
1.0000 | ORAL_TABLET | Freq: Two times a day (BID) | ORAL | 0 refills | Status: DC
  Filled 2023-03-29: qty 20, 10d supply, fill #0

## 2023-04-01 ENCOUNTER — Other Ambulatory Visit (HOSPITAL_COMMUNITY): Payer: Self-pay

## 2023-04-01 MED ORDER — PREDNISONE 20 MG PO TABS
20.0000 mg | ORAL_TABLET | Freq: Two times a day (BID) | ORAL | 0 refills | Status: AC
  Filled 2023-04-01: qty 10, 5d supply, fill #0

## 2023-04-18 ENCOUNTER — Other Ambulatory Visit (HOSPITAL_COMMUNITY): Payer: Self-pay

## 2023-05-09 ENCOUNTER — Other Ambulatory Visit (HOSPITAL_COMMUNITY): Payer: Self-pay

## 2023-05-10 ENCOUNTER — Other Ambulatory Visit (HOSPITAL_COMMUNITY): Payer: Self-pay

## 2023-05-10 ENCOUNTER — Other Ambulatory Visit: Payer: Self-pay

## 2023-05-10 MED ORDER — BUPROPION HCL ER (XL) 150 MG PO TB24
150.0000 mg | ORAL_TABLET | Freq: Every morning | ORAL | 2 refills | Status: DC
  Filled 2023-05-10: qty 30, 30d supply, fill #0

## 2023-05-16 ENCOUNTER — Other Ambulatory Visit (HOSPITAL_COMMUNITY): Payer: Self-pay

## 2023-05-16 MED ORDER — TOPIRAMATE 25 MG PO TABS
25.0000 mg | ORAL_TABLET | Freq: Every day | ORAL | 2 refills | Status: AC
  Filled 2023-05-16 – 2023-06-10 (×3): qty 30, 30d supply, fill #0

## 2023-05-16 MED ORDER — BUPROPION HCL ER (XL) 300 MG PO TB24
300.0000 mg | ORAL_TABLET | Freq: Every morning | ORAL | 2 refills | Status: AC
  Filled 2023-05-16: qty 30, 30d supply, fill #0
  Filled 2023-06-10: qty 30, 30d supply, fill #1

## 2023-06-10 ENCOUNTER — Other Ambulatory Visit (HOSPITAL_COMMUNITY): Payer: Self-pay

## 2023-06-10 ENCOUNTER — Other Ambulatory Visit: Payer: Self-pay

## 2023-06-14 ENCOUNTER — Other Ambulatory Visit (HOSPITAL_COMMUNITY): Payer: Self-pay

## 2023-06-14 MED ORDER — ROSUVASTATIN CALCIUM 5 MG PO TABS
5.0000 mg | ORAL_TABLET | Freq: Every day | ORAL | 6 refills | Status: AC
  Filled 2023-06-14: qty 30, 30d supply, fill #0

## 2023-06-27 ENCOUNTER — Other Ambulatory Visit: Payer: Self-pay | Admitting: Obstetrics and Gynecology

## 2023-06-27 DIAGNOSIS — R928 Other abnormal and inconclusive findings on diagnostic imaging of breast: Secondary | ICD-10-CM

## 2023-07-06 ENCOUNTER — Ambulatory Visit
Admission: RE | Admit: 2023-07-06 | Discharge: 2023-07-06 | Disposition: A | Discharge status: Home / Self Care | Payer: No Typology Code available for payment source | Source: Ambulatory Visit | Attending: Obstetrics and Gynecology | Admitting: Obstetrics and Gynecology

## 2023-07-06 DIAGNOSIS — R928 Other abnormal and inconclusive findings on diagnostic imaging of breast: Secondary | ICD-10-CM

## 2023-08-11 ENCOUNTER — Encounter: Payer: Self-pay | Admitting: Internal Medicine

## 2023-09-14 LAB — LAB REPORT - SCANNED
Creatinine, POC: 56.6 mg/dL
EGFR: 72

## 2023-09-15 ENCOUNTER — Other Ambulatory Visit (HOSPITAL_COMMUNITY): Payer: Self-pay

## 2023-09-15 MED ORDER — PROGESTERONE MICRONIZED 100 MG PO CAPS
100.0000 mg | ORAL_CAPSULE | Freq: Every day | ORAL | 6 refills | Status: AC
  Filled 2023-09-15: qty 30, 30d supply, fill #0
  Filled 2023-10-06 – 2023-10-07 (×2): qty 30, 30d supply, fill #1
  Filled 2023-10-10: qty 17, 17d supply, fill #1
  Filled 2023-10-10: qty 13, 13d supply, fill #1
  Filled 2023-10-10: qty 30, 30d supply, fill #1
  Filled 2023-11-05: qty 30, 30d supply, fill #2
  Filled 2023-11-29: qty 30, 30d supply, fill #3
  Filled ????-??-??: fill #1

## 2023-09-15 MED ORDER — ESTRADIOL 0.05 MG/24HR TD PTTW
1.0000 | MEDICATED_PATCH | TRANSDERMAL | 6 refills | Status: AC
  Filled 2023-09-15: qty 8, 28d supply, fill #0
  Filled 2023-10-06: qty 8, 28d supply, fill #1
  Filled 2023-11-05: qty 8, 28d supply, fill #2
  Filled 2023-11-29: qty 8, 28d supply, fill #3

## 2023-10-07 ENCOUNTER — Other Ambulatory Visit: Payer: Self-pay

## 2023-10-07 ENCOUNTER — Other Ambulatory Visit (HOSPITAL_COMMUNITY): Payer: Self-pay

## 2023-10-10 ENCOUNTER — Other Ambulatory Visit: Payer: Self-pay

## 2023-10-10 ENCOUNTER — Other Ambulatory Visit (HOSPITAL_COMMUNITY): Payer: Self-pay

## 2023-10-17 ENCOUNTER — Other Ambulatory Visit (HOSPITAL_COMMUNITY): Payer: Self-pay

## 2023-10-17 MED ORDER — PREDNISONE 10 MG PO TABS
ORAL_TABLET | ORAL | 0 refills | Status: AC
  Filled 2023-10-17: qty 21, 6d supply, fill #0

## 2023-10-31 LAB — LAB REPORT - SCANNED
A1c: 5.4
EGFR: 66

## 2023-11-21 ENCOUNTER — Encounter: Payer: Self-pay | Admitting: Internal Medicine

## 2023-11-21 ENCOUNTER — Ambulatory Visit (INDEPENDENT_AMBULATORY_CARE_PROVIDER_SITE_OTHER): Payer: No Typology Code available for payment source | Admitting: Internal Medicine

## 2023-11-21 VITALS — BP 124/72 | HR 65 | Temp 98.5°F | Ht 65.0 in | Wt 176.0 lb

## 2023-11-21 DIAGNOSIS — Z0000 Encounter for general adult medical examination without abnormal findings: Secondary | ICD-10-CM | POA: Diagnosis not present

## 2023-11-21 DIAGNOSIS — E7800 Pure hypercholesterolemia, unspecified: Secondary | ICD-10-CM

## 2023-11-21 DIAGNOSIS — J309 Allergic rhinitis, unspecified: Secondary | ICD-10-CM

## 2023-11-21 DIAGNOSIS — J4520 Mild intermittent asthma, uncomplicated: Secondary | ICD-10-CM

## 2023-11-21 DIAGNOSIS — Z0001 Encounter for general adult medical examination with abnormal findings: Secondary | ICD-10-CM | POA: Insufficient documentation

## 2023-11-21 NOTE — Assessment & Plan Note (Signed)
-   had recent labs per gyn Stable, pt to continue current statin crestor 5 mg every day,

## 2023-11-21 NOTE — Assessment & Plan Note (Signed)

## 2023-11-21 NOTE — Assessment & Plan Note (Signed)
Mild to mod, for zyrtec nasacort prn,,  to f/u any worsening symptoms or concerns

## 2023-11-21 NOTE — Patient Instructions (Signed)
Please continue all other medications as before, and refills have been done if requested.  Please have the pharmacy call with any other refills you may need.  Please continue your efforts at being more active, low cholesterol diet, and weight control.  You are otherwise up to date with prevention measures today.  Please keep your appointments with your specialists as you may have planned  Please make an Appointment to return for your 1 year visit, or sooner if needed 

## 2023-11-21 NOTE — Assessment & Plan Note (Signed)
Stable overall, cont inhaler prn 

## 2023-11-21 NOTE — Progress Notes (Signed)
Patient ID: Andrea Bonilla, female   DOB: [birth year], 52 y.o.   MRN: [id number]         Chief Complaint:: wellness exam and hld, asthma, allergies       HPI:  Andrea Bonilla is a 52 y.o. female here for wellness exam; declines hep c screen, covid booster, plans to see gyn soon, o/w up to date                        Also Pt denies chest pain, increased sob or doe, wheezing, orthopnea, PND, increased LE swelling, palpitations, dizziness or syncope.   Pt denies polydipsia, polyuria, or new focal neuro s/s.    Pt denies fever, wt loss, night sweats, loss of appetite, or other constitutional symptoms  Due for left rot cuff surgury dec 26 per dr Mallie Snooks Readings from Last 3 Encounters:  11/21/23 176 lb (79.8 kg)  11/17/22 172 lb (78 kg)  10/19/22 172 lb 14.4 oz (78.4 kg)   BP Readings from Last 3 Encounters:  11/21/23 124/72  11/17/22 112/68  10/19/22 120/70   Immunization History  Administered Date(s) Administered   Influenza Split 09/13/2011, 09/13/2012, 10/10/2020, 10/10/2021   Influenza Whole 09/04/2008, 09/05/2009, 09/07/2010   Influenza,inj,Quad PF,6+ Mos 07/31/2014, 10/02/2021, 10/05/2023   Influenza-Unspecified 09/15/2015, 08/06/2017, 09/04/2018, 09/04/2019, 10/10/2020, 09/20/2022   PFIZER(Purple Top)SARS-COV-2 Vaccination 08/06/2020, 09/05/2020, 04/05/2021   Td 12/06/2006   Tdap 11/08/2014   Zoster Recombinant(Shingrix) 11/13/2021, 06/06/2022   Health Maintenance Due  Topic Date Due   Hepatitis C Screening  Never done   Cervical Cancer Screening (HPV/Pap Cotest)  04/21/2018   COVID-19 Vaccine (4 - 2024-25 season) 08/07/2023      Past Medical History:  Diagnosis Date   ALLERGIC RHINITIS    ASTHMA    Asthma    HYPERLIPIDEMIA    IBS (irritable bowel syndrome)    Kidney stones    OSTEOPENIA    Splenic lesion    STRESS FRACTURE, FOOT    Past Surgical History:  Procedure Laterality Date   BREAST SURGERY     skin graft Right forehead      reports that she has  quit smoking. She has never used smokeless tobacco. She reports current alcohol use. She reports that she does not use drugs. family history includes Colon polyps in her brother, father, and mother; Coronary artery disease in an other family member; Diabetes in an other family member; Heart disease in her mother; Hyperlipidemia in her mother; Hypertension in her mother and another family member; Stroke in an other family member. Allergies  Allergen Reactions   Clarithromycin     REACTION: nausea   Codeine Other (See Comments)   Erythromycin    Moxifloxacin     REACTION: nausea   Current Outpatient Medications on File Prior to Visit  Medication Sig Dispense Refill   AMBULATORY NON FORMULARY MEDICATION Medication Name: Ultimate Flora Probiotic-30B Live Cultures- 1 capsule daily     AMBULATORY NON FORMULARY MEDICATION Medication Name: Amberen OTC menopausal Relief-- 2 tablets daily     Biotin 86578 MCG TABS Take 1 tablet by mouth daily.     buPROPion (WELLBUTRIN XL) 150 MG 24 hr tablet Take 1 tablet (150 mg total) by mouth in the morning. 30 tablet 2   buPROPion (WELLBUTRIN XL) 300 MG 24 hr tablet Take 1 tablet (300 mg total) by mouth in the morning. 30 tablet 2   CALCIUM-VITAMIN D PO Take 2 tablets by mouth  daily.     cetirizine (ZYRTEC ALLERGY) 10 MG tablet Take 1 tablet (10 mg total) by mouth daily. 30 tablet 0   chlorpheniramine-HYDROcodone (TUSSIONEX) 10-8 MG/5ML Take 5 mLs by mouth every 12 (twelve) hours as needed. 120 mL 0   Cholecalciferol (VITAMIN D3) 1000 UNITS CAPS Take 1 capsule by mouth daily.     Cyanocobalamin (VITAMIN B 12 PO) Take 100 mg by mouth daily.     dicyclomine (BENTYL) 10 MG capsule Take 1 capsule (10 mg total) by mouth 2 (two) times daily. 60 capsule 6   escitalopram (LEXAPRO) 10 MG tablet Take 1 tablet (10 mg total) by mouth daily. 30 tablet 6   estradiol (VIVELLE-DOT) 0.05 MG/24HR patch Place 1 patch (0.05 mg total) onto the skin 2 (two) times a week. 8 patch 6    ferrous sulfate 325 (65 FE) MG tablet Take 325 mg by mouth daily with breakfast.     Flaxseed, Linseed, 1000 MG CAPS Take 2 capsules by mouth daily.     ipratropium (ATROVENT) 0.06 % nasal spray Place 2 sprays into both nostrils 4 (four) times daily. 15 mL 0   L-Lysine 500 MG TABS Take 2 tablets by mouth daily.     meloxicam (MOBIC) 15 MG tablet Take 1 tablet (15 mg total) by mouth daily as needed for pain 14 tablet 0   meloxicam (MOBIC) 15 MG tablet Take 1 tablet (15 mg total) by mouth daily as needed 14 tablet 0   Multiple Vitamin (MULTIVITAMIN) tablet Take 1 tablet by mouth daily.     Multiple Vitamins-Minerals (HAIR SKIN & NAILS ADVANCED PO) Take 1 tablet by mouth daily.     mupirocin ointment (BACTROBAN) 2 % Apply 1 Application in nose 3 (three) times daily. 22 g 3   pantoprazole (PROTONIX) 40 MG tablet TAKE 1 TABLET BY MOUTH  DAILY 90 tablet 2   progesterone (PROMETRIUM) 100 MG capsule Take 1 capsule (100 mg total) by mouth at bedtime. 30 capsule 6   rosuvastatin (CRESTOR) 5 MG tablet Take 1 tablet (5 mg total) by mouth daily. 30 tablet 6   topiramate (TOPAMAX) 25 MG tablet Take 1 tablet (25 mg total) by mouth daily. 30 tablet 1   topiramate (TOPAMAX) 25 MG tablet Take 1 tablet (25 mg total) by mouth daily. 30 tablet 1   topiramate (TOPAMAX) 25 MG tablet Take 1 tablet (25 mg total) by mouth daily. 30 tablet 1   topiramate (TOPAMAX) 25 MG tablet Take 1 tablet (25 mg total) by mouth daily. 30 tablet 2   topiramate (TOPAMAX) 25 MG tablet Take 1 tablet (25 mg total) by mouth daily. 30 tablet 2   vitamin E 400 UNIT capsule Take 400 Units by mouth daily.     No current facility-administered medications on file prior to visit.        ROS:  All others reviewed and negative.  Objective        PE:  BP 124/72 (BP Location: Left Arm, Patient Position: Sitting, Cuff Size: Normal)   Pulse 65   Temp 98.5 F (36.9 C) (Oral)   Ht 5\' 5"  (1.651 m)   Wt 176 lb (79.8 kg)   SpO2 99%   BMI 29.29 kg/m                  Constitutional: Pt appears in NAD               HENT: Head: NCAT.  Right Ear: External ear normal.                 Left Ear: External ear normal.                Eyes: . Pupils are equal, round, and reactive to light. Conjunctivae and EOM are normal               Nose: without d/c or deformity               Neck: Neck supple. Gross normal ROM               Cardiovascular: Normal rate and regular rhythm.                 Pulmonary/Chest: Effort normal and breath sounds without rales or wheezing.                Abd:  Soft, NT, ND, + BS, no organomegaly               Neurological: Pt is alert. At baseline orientation, motor grossly intact               Skin: Skin is warm. No rashes, no other new lesions, LE edema - none               Psychiatric: Pt behavior is normal without agitation   Micro: none  Cardiac tracings I have personally interpreted today:  none  Pertinent Radiological findings (summarize): none   Lab Results  Component Value Date   WBC 7.7 07/17/2019   HGB 13.5 07/17/2019   HCT 39.1 07/17/2019   PLT 239 07/17/2019   GLUCOSE 110 (H) 07/17/2019   CHOL 238 (H) 11/08/2014   TRIG 203.0 (H) 11/08/2014   HDL 65.00 11/08/2014   LDLDIRECT 139.2 11/08/2014   ALT 21 07/17/2019   AST 21 07/17/2019   NA 134 (L) 07/17/2019   K 3.7 07/17/2019   CL 101 07/17/2019   CREATININE 0.89 07/17/2019   BUN 9 07/17/2019   CO2 24 07/17/2019   TSH 2.98 11/08/2014   INR 0.89 06/17/2011   Assessment/Plan:  Andrea Bonilla is a 52 y.o. White or Caucasian [1] female with  has a past medical history of ALLERGIC RHINITIS, ASTHMA, Asthma, HYPERLIPIDEMIA, IBS (irritable bowel syndrome), Kidney stones, OSTEOPENIA, Splenic lesion, and STRESS FRACTURE, FOOT.  Encounter for well adult exam with abnormal findings Age and sex appropriate education and counseling updated with regular exercise and diet Referrals for preventative services - none needed Immunizations  addressed - declines covid booster Smoking counseling  - none needed Evidence for depression or other mood disorder - none significant Most recent labs reviewed. I have personally reviewed and have noted: 1) the patient's medical and social history 2) The patient's current medications and supplements 3) The patient's height, weight, and BMI have been recorded in the chart   HLD (hyperlipidemia)  - had recent labs per gyn Stable, pt to continue current statin crestor 5 mg every day,   Asthma Stable overall, cont inhaler prn  Allergic rhinitis Mild to mod, for zyrtec nasacort prn,,  to f/u any worsening symptoms or concerns  Followup: Return in about 1 year (around 11/20/2024).  Oliver Barre, MD 11/21/2023 9:17 PM [facility] [place] [facility]

## 2023-11-23 ENCOUNTER — Other Ambulatory Visit: Payer: Self-pay | Admitting: Obstetrics and Gynecology

## 2023-11-23 DIAGNOSIS — Z87891 Personal history of nicotine dependence: Secondary | ICD-10-CM

## 2023-11-29 ENCOUNTER — Other Ambulatory Visit: Payer: Self-pay

## 2023-11-29 ENCOUNTER — Other Ambulatory Visit (HOSPITAL_COMMUNITY): Payer: Self-pay

## 2023-12-01 ENCOUNTER — Other Ambulatory Visit (HOSPITAL_COMMUNITY): Payer: Self-pay

## 2023-12-01 MED ORDER — BACLOFEN 10 MG PO TABS
10.0000 mg | ORAL_TABLET | Freq: Three times a day (TID) | ORAL | 0 refills | Status: AC | PRN
  Filled 2023-12-01: qty 15, 5d supply, fill #0

## 2023-12-01 MED ORDER — OXYCODONE HCL 5 MG PO TABS
5.0000 mg | ORAL_TABLET | Freq: Four times a day (QID) | ORAL | 0 refills | Status: AC | PRN
Start: 2023-12-01 — End: ?
  Filled 2023-12-01: qty 28, 7d supply, fill #0

## 2023-12-01 MED ORDER — MELOXICAM 15 MG PO TABS
15.0000 mg | ORAL_TABLET | Freq: Every day | ORAL | 2 refills | Status: AC | PRN
  Filled 2023-12-01: qty 30, 30d supply, fill #0
  Filled 2024-01-26: qty 30, 30d supply, fill #1
  Filled 2024-02-23: qty 30, 30d supply, fill #2

## 2023-12-01 MED ORDER — ONDANSETRON 4 MG PO TBDP
4.0000 mg | ORAL_TABLET | Freq: Three times a day (TID) | ORAL | 0 refills | Status: AC | PRN
  Filled 2023-12-01: qty 15, 5d supply, fill #0

## 2023-12-01 MED ORDER — ASPIRIN 81 MG PO CHEW
81.0000 mg | CHEWABLE_TABLET | Freq: Two times a day (BID) | ORAL | 0 refills | Status: AC
  Filled 2023-12-01: qty 60, 30d supply, fill #0

## 2023-12-01 MED ORDER — ACETAMINOPHEN EXTRA STRENGTH 500 MG PO TABS
1000.0000 mg | ORAL_TABLET | Freq: Four times a day (QID) | ORAL | 1 refills | Status: AC | PRN
  Filled 2023-12-01: qty 100, 13d supply, fill #0
  Filled 2024-11-06: qty 100, 13d supply, fill #1

## 2023-12-28 ENCOUNTER — Other Ambulatory Visit (HOSPITAL_COMMUNITY): Payer: Self-pay

## 2023-12-28 MED ORDER — LOMAIRA 8 MG PO TABS
8.0000 mg | ORAL_TABLET | Freq: Every day | ORAL | 1 refills | Status: AC
Start: 2023-12-28 — End: ?
  Filled 2023-12-28: qty 30, 30d supply, fill #0
  Filled 2024-06-14: qty 30, 30d supply, fill #1

## 2023-12-29 ENCOUNTER — Other Ambulatory Visit (HOSPITAL_COMMUNITY): Payer: Self-pay

## 2024-01-13 ENCOUNTER — Other Ambulatory Visit: Payer: Self-pay | Admitting: Internal Medicine

## 2024-01-13 NOTE — Telephone Encounter (Signed)
 Rx request needs to be sent to ordering provider specialty. Called and updated patient that refill request could not be processed. Patient verbalized understanding.

## 2024-01-13 NOTE — Telephone Encounter (Signed)
 Copied from CRM [id number]. Topic: Clinical - Medication Refill >> Jan 13, 2024  2:57 PM Adaysia C wrote: Most Recent Primary Care Visit:  Provider: NORLEEN LYNWOOD ORN  Department: [facility]  Visit Type: PHYSICAL  Date: 11/21/2023  Medication: pantoprazole  (PROTONIX ) 40 MG tablet  Has the patient contacted their pharmacy? No, Patient contacted provider to initiate RX refill because she forgot to get it re-prescribed during her appointment on 11/21/2023.  (Agent: If no, request that the patient contact the pharmacy for the refill. If patient does not wish to contact the pharmacy document the reason why and proceed with request.) (Agent: If yes, when and what did the pharmacy advise?)  Is this the correct pharmacy for this prescription? Yes If no, delete pharmacy and type the correct one.  This is the patient's preferred pharmacy:   [facility] [place], [place] - [address] [place] [address] Phone: [phone] Fax: [phone]  Has the prescription been filled recently? No  Is the patient out of the medication? Yes  Has the patient been seen for an appointment in the last year OR does the patient have an upcoming appointment? Yes  Can we respond through MyChart? Yes  Agent: Please be advised that Rx refills may take up to 3 business days. We ask that you follow-up with your pharmacy.

## 2024-01-25 ENCOUNTER — Other Ambulatory Visit (HOSPITAL_COMMUNITY): Payer: Self-pay

## 2024-01-25 MED ORDER — LOMAIRA 8 MG PO TABS
8.0000 mg | ORAL_TABLET | Freq: Every day | ORAL | 1 refills | Status: AC
  Filled 2024-01-26: qty 30, 30d supply, fill #0
  Filled 2024-02-23 – 2024-02-24 (×3): qty 30, 30d supply, fill #1

## 2024-01-26 ENCOUNTER — Other Ambulatory Visit: Payer: Self-pay

## 2024-01-26 ENCOUNTER — Other Ambulatory Visit (HOSPITAL_COMMUNITY): Payer: Self-pay

## 2024-02-08 ENCOUNTER — Other Ambulatory Visit (HOSPITAL_COMMUNITY): Payer: Self-pay

## 2024-02-23 ENCOUNTER — Other Ambulatory Visit (HOSPITAL_COMMUNITY): Payer: Self-pay

## 2024-02-24 ENCOUNTER — Other Ambulatory Visit (HOSPITAL_COMMUNITY): Payer: Self-pay

## 2024-02-24 ENCOUNTER — Other Ambulatory Visit: Payer: Self-pay

## 2024-03-12 ENCOUNTER — Other Ambulatory Visit (HOSPITAL_COMMUNITY): Payer: Self-pay

## 2024-03-12 MED ORDER — LOMAIRA 8 MG PO TABS
16.0000 mg | ORAL_TABLET | Freq: Every day | ORAL | 1 refills | Status: AC
  Filled 2024-03-12: qty 60, 30d supply, fill #0
  Filled 2024-07-10 – 2024-07-12 (×2): qty 60, 30d supply, fill #1

## 2024-04-24 ENCOUNTER — Other Ambulatory Visit (HOSPITAL_COMMUNITY): Payer: Self-pay

## 2024-04-24 MED ORDER — LOMAIRA 8 MG PO TABS
16.0000 mg | ORAL_TABLET | Freq: Every day | ORAL | 1 refills | Status: DC
Start: 2024-04-24 — End: 2024-10-03
  Filled 2024-04-24: qty 60, 30d supply, fill #0
  Filled 2024-08-29: qty 60, 30d supply, fill #1

## 2024-06-14 ENCOUNTER — Other Ambulatory Visit (HOSPITAL_COMMUNITY): Payer: Self-pay

## 2024-07-10 ENCOUNTER — Other Ambulatory Visit (HOSPITAL_COMMUNITY): Payer: Self-pay

## 2024-07-12 ENCOUNTER — Other Ambulatory Visit (HOSPITAL_COMMUNITY): Payer: Self-pay

## 2024-08-29 ENCOUNTER — Other Ambulatory Visit (HOSPITAL_COMMUNITY): Payer: Self-pay

## 2024-10-03 ENCOUNTER — Other Ambulatory Visit (HOSPITAL_COMMUNITY): Payer: Self-pay

## 2024-10-03 MED ORDER — PHENTERMINE HCL 8 MG PO TABS
16.0000 mg | ORAL_TABLET | Freq: Every morning | ORAL | 1 refills | Status: AC
  Filled 2024-10-03: qty 60, 30d supply, fill #0
  Filled 2024-11-06: qty 60, 30d supply, fill #1

## 2024-10-08 ENCOUNTER — Other Ambulatory Visit (HOSPITAL_COMMUNITY): Payer: Self-pay

## 2024-10-08 MED ORDER — NITROFURANTOIN MONOHYD MACRO 100 MG PO CAPS
100.0000 mg | ORAL_CAPSULE | Freq: Two times a day (BID) | ORAL | 1 refills | Status: AC
  Filled 2024-10-08: qty 10, 5d supply, fill #0
  Filled 2024-10-29: qty 10, 5d supply, fill #1

## 2024-10-31 LAB — LAB REPORT - SCANNED
A1c: 4.9
EGFR: 65

## 2024-11-06 ENCOUNTER — Other Ambulatory Visit: Payer: Self-pay

## 2024-11-06 ENCOUNTER — Other Ambulatory Visit (HOSPITAL_COMMUNITY): Payer: Self-pay

## 2024-11-21 ENCOUNTER — Encounter: Payer: Self-pay | Admitting: Internal Medicine

## 2024-11-21 ENCOUNTER — Ambulatory Visit: Payer: No Typology Code available for payment source | Admitting: Internal Medicine

## 2024-11-21 ENCOUNTER — Ambulatory Visit: Payer: Self-pay | Admitting: Internal Medicine

## 2024-11-21 VITALS — BP 122/80 | HR 65 | Temp 98.0°F | Ht 65.0 in | Wt 183.0 lb

## 2024-11-21 DIAGNOSIS — C44612 Basal cell carcinoma of skin of right upper limb, including shoulder: Secondary | ICD-10-CM

## 2024-11-21 DIAGNOSIS — E559 Vitamin D deficiency, unspecified: Secondary | ICD-10-CM

## 2024-11-21 DIAGNOSIS — Z23 Encounter for immunization: Secondary | ICD-10-CM | POA: Diagnosis not present

## 2024-11-21 DIAGNOSIS — R739 Hyperglycemia, unspecified: Secondary | ICD-10-CM | POA: Diagnosis not present

## 2024-11-21 DIAGNOSIS — E7800 Pure hypercholesterolemia, unspecified: Secondary | ICD-10-CM

## 2024-11-21 DIAGNOSIS — E538 Deficiency of other specified B group vitamins: Secondary | ICD-10-CM

## 2024-11-21 DIAGNOSIS — C4491 Basal cell carcinoma of skin, unspecified: Secondary | ICD-10-CM | POA: Insufficient documentation

## 2024-11-21 DIAGNOSIS — Z0000 Encounter for general adult medical examination without abnormal findings: Secondary | ICD-10-CM

## 2024-11-21 LAB — HEPATIC FUNCTION PANEL
ALT: 14 U/L (ref 3–35)
AST: 15 U/L (ref 5–37)
Albumin: 4.3 g/dL (ref 3.5–5.2)
Alkaline Phosphatase: 58 U/L (ref 39–117)
Bilirubin, Direct: 0 mg/dL — ABNORMAL LOW (ref 0.1–0.3)
Total Bilirubin: 0.3 mg/dL (ref 0.2–1.2)
Total Protein: 7 g/dL (ref 6.0–8.3)

## 2024-11-21 LAB — LIPID PANEL
Cholesterol: 172 mg/dL (ref 28–200)
HDL: 61.1 mg/dL (ref >39.00)
LDL Cholesterol: 84 mg/dL (ref 10–99)
NonHDL: 110.68
Total CHOL/HDL Ratio: 3
Triglycerides: 132 mg/dL (ref 10.0–149.0)
VLDL: 26.4 mg/dL (ref 0.0–40.0)

## 2024-11-21 LAB — BASIC METABOLIC PANEL WITH GFR
BUN: 21 mg/dL (ref 6–23)
CO2: 31 meq/L (ref 19–32)
Calcium: 9.2 mg/dL (ref 8.4–10.5)
Chloride: 100 meq/L (ref 96–112)
Creatinine, Ser: 0.97 mg/dL (ref 0.40–1.20)
GFR: 66.09 mL/min (ref >60.00)
Glucose, Bld: 79 mg/dL (ref 70–99)
Potassium: 3.7 meq/L (ref 3.5–5.1)
Sodium: 137 meq/L (ref 135–145)

## 2024-11-21 LAB — URINALYSIS, ROUTINE W REFLEX MICROSCOPIC
Bilirubin Urine: NEGATIVE
Ketones, ur: NEGATIVE
Nitrite: NEGATIVE
Specific Gravity, Urine: <=1.005 — AB (ref 1.000–1.030)
Total Protein, Urine: NEGATIVE
Urine Glucose: NEGATIVE
Urobilinogen, UA: 0.2 (ref 0.0–1.0)
pH: 6 (ref 5.0–8.0)

## 2024-11-21 LAB — TSH: TSH: 2.38 u[IU]/mL (ref 0.35–5.50)

## 2024-11-21 LAB — VITAMIN D 25 HYDROXY (VIT D DEFICIENCY, FRACTURES): VITD: 24.47 ng/mL — ABNORMAL LOW (ref 30.00–100.00)

## 2024-11-21 LAB — HEMOGLOBIN A1C: Hgb A1c MFr Bld: 5 % (ref 4.6–6.5)

## 2024-11-21 LAB — CBC WITH DIFFERENTIAL/PLATELET
Basophils Absolute: 0.1 K/uL (ref 0.0–0.1)
Basophils Relative: 0.8 % (ref 0.0–3.0)
Eosinophils Absolute: 0.1 K/uL (ref 0.0–0.7)
Eosinophils Relative: 1.7 % (ref 0.0–5.0)
HCT: 38.4 % (ref 36.0–46.0)
Hemoglobin: 13.1 g/dL (ref 12.0–15.0)
Lymphocytes Relative: 28 % (ref 12.0–46.0)
Lymphs Abs: 1.9 K/uL (ref 0.7–4.0)
MCHC: 34.3 g/dL (ref 30.0–36.0)
MCV: 88.3 fl (ref 78.0–100.0)
Monocytes Absolute: 0.5 K/uL (ref 0.1–1.0)
Monocytes Relative: 7.3 % (ref 3.0–12.0)
Neutro Abs: 4.3 K/uL (ref 1.4–7.7)
Neutrophils Relative %: 62.2 % (ref 43.0–77.0)
Platelets: 198 K/uL (ref 150.0–400.0)
RBC: 4.35 Mil/uL (ref 3.87–5.11)
RDW: 12.4 % (ref 11.5–15.5)
WBC: 6.9 K/uL (ref 4.0–10.5)

## 2024-11-21 LAB — VITAMIN B12: Vitamin B-12: 725 pg/mL (ref 211–911)

## 2024-11-21 NOTE — Progress Notes (Signed)
 Patient ID: Andrea Bonilla, female   DOB: 10-18-1971, 53 y.o.   MRN: [id number]         Chief Complaint:: wellness exam and Annual Exam (Had her skin cancer removed last tuesday)  To right deltoid area - basal cell ca, hld       HPI:  Andrea Bonilla is a 53 y.o. female here for wellness exam, for GYN pap later today, for prevnar 20 today, declines covid , tdap and hep B vaccine, o/w up to date                Also Pt denies chest pain, increased sob or doe, wheezing, orthopnea, PND, increased LE swelling, palpitations, dizziness or syncope.   Pt denies polydipsia, polyuria, or new focal neuro s/s.    Pt denies fever, wt loss, night sweats, loss of appetite, or other constitutional symptoms     Wt Readings from Last 3 Encounters:  11/21/24 183 lb (83 kg)  11/21/23 176 lb (79.8 kg)  11/17/22 172 lb (78 kg)   BP Readings from Last 3 Encounters:  11/21/24 122/80  11/21/23 124/72  11/17/22 112/68   Immunization History  Administered Date(s) Administered   Influenza Split 09/13/2011, 09/13/2012, 10/10/2020, 10/10/2021   Influenza Whole 09/04/2008, 09/05/2009, 09/07/2010   Influenza,inj,Quad PF,6+ Mos 07/31/2014, 10/02/2021, 10/05/2023, 09/24/2024   Influenza-Unspecified 09/15/2015, 08/06/2017, 09/04/2018, 09/04/2019, 10/10/2020, 09/20/2022   PFIZER(Purple Top)SARS-COV-2 Vaccination 08/06/2020, 09/05/2020, 04/05/2021   PNEUMOCOCCAL CONJUGATE-20 11/21/2024   Td 12/06/2006   Tdap 11/08/2014   Zoster Recombinant(Shingrix) 11/13/2021, 06/06/2022   Health Maintenance Due  Topic Date Due   Hepatitis C Screening  Never done   Hepatitis B Vaccines 19-59 Average Risk (1 of 3 - 19+ 3-dose series) Never done   Cervical Cancer Screening (HPV/Pap Cotest)  04/21/2018   COVID-19 Vaccine (4 - 2025-26 season) 08/06/2024   DTaP/Tdap/Td (3 - Td or Tdap) 11/08/2024      Past Medical History:  Diagnosis Date   ALLERGIC RHINITIS    ASTHMA    Asthma    HYPERLIPIDEMIA    IBS (irritable bowel  syndrome)    Kidney stones    OSTEOPENIA    Splenic lesion    STRESS FRACTURE, FOOT    Past Surgical History:  Procedure Laterality Date   BREAST SURGERY     skin graft Right forehead      reports that she has quit smoking. She has never used smokeless tobacco. She reports current alcohol use. She reports that she does not use drugs. family history includes Colon polyps in her brother, father, and mother; Coronary artery disease in an other family member; Diabetes in an other family member; Heart disease in her mother; Hyperlipidemia in her mother; Hypertension in her mother and another family member; Stroke in an other family member. Allergies[1] Medications Ordered Prior to Encounter[2]      ROS:  All others reviewed and negative.  Objective        PE:  BP 122/80 (BP Location: Right Arm, Patient Position: Sitting, Cuff Size: Normal)   Pulse 65   Temp 98 F (36.7 C) (Oral)   Ht 5' 5 (1.651 m)   Wt 183 lb (83 kg)   LMP  (LMP Unknown)   SpO2 99%   BMI 30.45 kg/m                 Constitutional: Pt appears in NAD               HENT: Head: NCAT.  Right Ear: External ear normal.                 Left Ear: External ear normal.                Eyes: . Pupils are equal, round, and reactive to light. Conjunctivae and EOM are normal               Nose: without d/c or deformity               Neck: Neck supple. Gross normal ROM               Cardiovascular: Normal rate and regular rhythm.                 Pulmonary/Chest: Effort normal and breath sounds without rales or wheezing.                Abd:  Soft, NT, ND, + BS, no organomegaly               Neurological: Pt is alert. At baseline orientation, motor grossly intact               Skin: Skin is warm. No rashes, no other new lesions, LE edema - none               Psychiatric: Pt behavior is normal without agitation   Micro: none  Cardiac tracings I have personally interpreted today:  none  Pertinent Radiological  findings (summarize): none   Lab Results  Component Value Date   WBC 6.9 11/21/2024   HGB 13.1 11/21/2024   HCT 38.4 11/21/2024   PLT 198.0 11/21/2024   GLUCOSE 79 11/21/2024   CHOL 172 11/21/2024   TRIG 132.0 11/21/2024   HDL 61.10 11/21/2024   LDLDIRECT 139.2 11/08/2014   LDLCALC 84 11/21/2024   ALT 14 11/21/2024   AST 15 11/21/2024   NA 137 11/21/2024   K 3.7 11/21/2024   CL 100 11/21/2024   CREATININE 0.97 11/21/2024   BUN 21 11/21/2024   CO2 31 11/21/2024   TSH 2.38 11/21/2024   INR 0.89 06/17/2011   HGBA1C 5.0 11/21/2024   Assessment/Plan:  Andrea Bonilla is a 53 y.o. White or Caucasian [1] female with  has a past medical history of ALLERGIC RHINITIS, ASTHMA, Asthma, HYPERLIPIDEMIA, IBS (irritable bowel syndrome), Kidney stones, OSTEOPENIA, Splenic lesion, and STRESS FRACTURE, FOOT.  Preventative health care Age and sex appropriate education and counseling updated with regular exercise and diet Referrals for preventative services - none needed Immunizations addressed - for prevnar 20 today, declines all other immunizations Smoking counseling  - none needed Evidence for depression or other mood disorder - none significant Most recent labs reviewed. I have personally reviewed and have noted: 1) the patient's medical and social history 2) The patient's current medications and supplements 3) The patient's height, weight, and BMI have been recorded in the chart   HLD (hyperlipidemia) Lab Results  Component Value Date   LDLCALC 84 11/21/2024   Stable, pt to continue current statin crestor  5 mg qd   Basal cell carcinoma Recent site stable, cont f/u derm as planned  Followup: Return in about 1 year (around 11/21/2025).  Lynwood Rush, MD 11/21/2024 8:16 PM [facility] [place] [facility]     [1]  Allergies Allergen Reactions   Clarithromycin     REACTION: nausea   Codeine Other (See Comments)    Erythromycin  Moxifloxacin     REACTION: nausea  [2]  Current Outpatient Medications on File Prior to Visit  Medication Sig Dispense Refill   Acetaminophen  Extra Strength 500 MG TABS Take 2 tablets (1,000 mg total) by mouth every 6 (six) hours as needed for pain 100 tablet 1   AMBULATORY NON FORMULARY MEDICATION Medication Name: Ultimate Flora Probiotic-30B Live Cultures- 1 capsule daily     AMBULATORY NON FORMULARY MEDICATION Medication Name: Amberen OTC menopausal Relief-- 2 tablets daily     aspirin  81 MG chewable tablet Chew 1 tablet (81 mg total) by mouth 2 (two) times daily to prevent blood clots after surgery 60 tablet 0   baclofen  (LIORESAL ) 10 MG tablet Take 1 tablet (10 mg total) by mouth every 8 (eight) hours as needed for muscle spasms 15 tablet 0   Biotin 89999 MCG TABS Take 1 tablet by mouth daily.     buPROPion  (WELLBUTRIN  XL) 150 MG 24 hr tablet Take 1 tablet (150 mg total) by mouth in the morning. 30 tablet 2   buPROPion  (WELLBUTRIN  XL) 300 MG 24 hr tablet Take 1 tablet (300 mg total) by mouth in the morning. 30 tablet 2   CALCIUM -VITAMIN D  PO Take 2 tablets by mouth daily.     cetirizine  (ZYRTEC  ALLERGY ) 10 MG tablet Take 1 tablet (10 mg total) by mouth daily. 30 tablet 0   chlorpheniramine-HYDROcodone (TUSSIONEX) 10-8 MG/5ML Take 5 mLs by mouth every 12 (twelve) hours as needed. 120 mL 0   Cholecalciferol (VITAMIN D3) 1000 UNITS CAPS Take 1 capsule by mouth daily.     Cyanocobalamin (VITAMIN B 12 PO) Take 100 mg by mouth daily.     dicyclomine  (BENTYL ) 10 MG capsule Take 1 capsule (10 mg total) by mouth 2 (two) times daily. 60 capsule 6   escitalopram  (LEXAPRO ) 10 MG tablet Take 1 tablet (10 mg total) by mouth daily. 30 tablet 6   estradiol  (VIVELLE -DOT) 0.05 MG/24HR patch Place 1 patch (0.05 mg total) onto the skin 2 (two) times a week. 8 patch 6   ferrous sulfate 325 (65 FE) MG tablet Take 325 mg by mouth daily with breakfast.     Flaxseed, Linseed, 1000 MG CAPS Take 2  capsules by mouth daily.     ipratropium (ATROVENT ) 0.06 % nasal spray Place 2 sprays into both nostrils 4 (four) times daily. 15 mL 0   L-Lysine 500 MG TABS Take 2 tablets by mouth daily.     meloxicam  (MOBIC ) 15 MG tablet Take 1 tablet (15 mg total) by mouth daily as needed for pain 14 tablet 0   meloxicam  (MOBIC ) 15 MG tablet Take 1 tablet (15 mg total) by mouth daily as needed 14 tablet 0   meloxicam  (MOBIC ) 15 MG tablet Take 1 tablet (15 mg total) by mouth daily as needed for pain and swelling. DO NOT TAKE AT THE SAME TIME AS IBUPROFEN OR CELEBREX 30 tablet 2   Multiple Vitamin (MULTIVITAMIN) tablet Take 1 tablet by mouth daily.     Multiple Vitamins-Minerals (HAIR SKIN & NAILS ADVANCED PO) Take 1 tablet by mouth daily.     mupirocin  ointment (BACTROBAN ) 2 % Apply 1 Application in nose 3 (three) times daily. 22 g 3   nitrofurantoin , macrocrystal-monohydrate, (MACROBID ) 100 MG capsule Take 1 capsule (100 mg total) by mouth every 12 (twelve) hours for 5 days 10 capsule 1   ondansetron  (ZOFRAN -ODT) 4 MG disintegrating tablet Take 1 tablet (4 mg total) by mouth up to 3 (three) times daily  as needed for nausea and vomiting 15 tablet 0   oxyCODONE  (OXY IR/ROXICODONE ) 5 MG immediate release tablet Take 1 tablet (5 mg total) by mouth every 6 to 8 hours as needed for pain 28 tablet 0   pantoprazole  (PROTONIX ) 40 MG tablet TAKE 1 TABLET BY MOUTH  DAILY 90 tablet 2   Phentermine  HCl (LOMAIRA ) 8 MG TABS Take 1 tablet (8 mg total) by mouth once daily, 30 minutes before breakfast. 30 tablet 1   Phentermine  HCl (LOMAIRA ) 8 MG TABS Take 1 tablet (8 mg total) by mouth daily 30 minutes before breakfast. 30 tablet 1   Phentermine  HCl (LOMAIRA ) 8 MG TABS Take 2 tablets (16 mg total) by mouth daily before breakfast. 60 tablet 1   Phentermine  HCl (LOMAIRA ) 8 MG TABS Take 2 tablets (16 mg total) by mouth in the morning before breakfast. 60 tablet 1   progesterone  (PROMETRIUM ) 100 MG capsule Take 1 capsule (100 mg  total) by mouth at bedtime. 30 capsule 6   rosuvastatin  (CRESTOR ) 5 MG tablet Take 1 tablet (5 mg total) by mouth daily. 30 tablet 6   topiramate  (TOPAMAX ) 25 MG tablet Take 1 tablet (25 mg total) by mouth daily. 30 tablet 1   topiramate  (TOPAMAX ) 25 MG tablet Take 1 tablet (25 mg total) by mouth daily. 30 tablet 1   topiramate  (TOPAMAX ) 25 MG tablet Take 1 tablet (25 mg total) by mouth daily. 30 tablet 1   topiramate  (TOPAMAX ) 25 MG tablet Take 1 tablet (25 mg total) by mouth daily. 30 tablet 2   topiramate  (TOPAMAX ) 25 MG tablet Take 1 tablet (25 mg total) by mouth daily. 30 tablet 2   vitamin E 400 UNIT capsule Take 400 Units by mouth daily.     No current facility-administered medications on file prior to visit.

## 2024-11-21 NOTE — Assessment & Plan Note (Signed)
 Recent site stable, cont f/u derm as planned

## 2024-11-21 NOTE — Assessment & Plan Note (Signed)
 Age and sex appropriate education and counseling updated with regular exercise and diet Referrals for preventative services - none needed Immunizations addressed - for prevnar 20 today, declines all other immunizations Smoking counseling  - none needed Evidence for depression or other mood disorder - none significant Most recent labs reviewed. I have personally reviewed and have noted: 1) the patient's medical and social history 2) The patient's current medications and supplements 3) The patient's height, weight, and BMI have been recorded in the chart

## 2024-11-21 NOTE — Patient Instructions (Signed)
 You had the Prevnar 20 pneumonia shot today  Please continue all other medications as before, and refills have been done if requested.  Please have the pharmacy call with any other refills you may need.  Please continue your efforts at being more active, low cholesterol diet, and weight control.  You are otherwise up to date with prevention measures today.  Please keep your appointments with your specialists as you may have planned - GYN later today  Please go to the LAB at the blood drawing area for the tests to be done  You will be contacted by phone if any changes need to be made immediately.  Otherwise, you will receive a letter about your results with an explanation, but please check with MyChart first.  Please make an Appointment to return for your 1 year visit, or sooner if needed

## 2024-11-21 NOTE — Assessment & Plan Note (Signed)
 Lab Results  Component Value Date   LDLCALC 84 11/21/2024   Stable, pt to continue current statin crestor  5 mg qd

## 2024-11-23 ENCOUNTER — Other Ambulatory Visit (HOSPITAL_COMMUNITY): Payer: Self-pay

## 2024-11-23 MED ORDER — SULFAMETHOXAZOLE-TRIMETHOPRIM 800-160 MG PO TABS
1.0000 | ORAL_TABLET | Freq: Two times a day (BID) | ORAL | 0 refills | Status: AC
  Filled 2024-11-23: qty 14, 7d supply, fill #0

## 2024-11-23 MED ORDER — PHENAZOPYRIDINE HCL 200 MG PO TABS
200.0000 mg | ORAL_TABLET | Freq: Three times a day (TID) | ORAL | 0 refills | Status: AC | PRN
  Filled 2024-11-23: qty 9, 3d supply, fill #0
